# Patient Record
Sex: Female | Born: 1995 | Race: White | Hispanic: No | Marital: Single | Smoking: Never smoker
Health system: Southern US, Community
[De-identification: ages and names within clinical notes are randomized; demographics above are authoritative.]

## PROBLEM LIST (undated history)

## (undated) DIAGNOSIS — R87629 Unspecified abnormal cytological findings in specimens from vagina: Secondary | ICD-10-CM

## (undated) DIAGNOSIS — N200 Calculus of kidney: Secondary | ICD-10-CM

## (undated) DIAGNOSIS — Z3490 Encounter for supervision of normal pregnancy, unspecified, unspecified trimester: Secondary | ICD-10-CM

## (undated) DIAGNOSIS — Z789 Other specified health status: Secondary | ICD-10-CM

## (undated) HISTORY — DX: Other specified health status: Z78.9

## (undated) HISTORY — DX: Unspecified abnormal cytological findings in specimens from vagina: R87.629

## (undated) HISTORY — PX: NO PAST SURGERIES: SHX2092

---

## 2018-01-07 ENCOUNTER — Other Ambulatory Visit: Payer: Self-pay

## 2018-01-07 ENCOUNTER — Emergency Department (HOSPITAL_COMMUNITY): Payer: Medicaid Other

## 2018-01-07 ENCOUNTER — Emergency Department (HOSPITAL_COMMUNITY)
Admission: EM | Admit: 2018-01-07 | Discharge: 2018-01-08 | Disposition: A | Discharge status: Home / Self Care | Payer: Medicaid Other | Attending: Emergency Medicine | Admitting: Emergency Medicine

## 2018-01-07 ENCOUNTER — Encounter (HOSPITAL_COMMUNITY): Payer: Self-pay | Admitting: Emergency Medicine

## 2018-01-07 DIAGNOSIS — Z2913 Encounter for prophylactic Rho(D) immune globulin: Secondary | ICD-10-CM | POA: Diagnosis not present

## 2018-01-07 DIAGNOSIS — O360110 Maternal care for anti-D [Rh] antibodies, first trimester, not applicable or unspecified: Secondary | ICD-10-CM | POA: Insufficient documentation

## 2018-01-07 DIAGNOSIS — Z3A01 Less than 8 weeks gestation of pregnancy: Secondary | ICD-10-CM | POA: Diagnosis not present

## 2018-01-07 DIAGNOSIS — O43891 Other placental disorders, first trimester: Secondary | ICD-10-CM | POA: Insufficient documentation

## 2018-01-07 DIAGNOSIS — O200 Threatened abortion: Secondary | ICD-10-CM | POA: Diagnosis not present

## 2018-01-07 DIAGNOSIS — O468X1 Other antepartum hemorrhage, first trimester: Secondary | ICD-10-CM

## 2018-01-07 DIAGNOSIS — O418X10 Other specified disorders of amniotic fluid and membranes, first trimester, not applicable or unspecified: Secondary | ICD-10-CM

## 2018-01-07 HISTORY — DX: Encounter for supervision of normal pregnancy, unspecified, unspecified trimester: Z34.90

## 2018-01-07 LAB — BASIC METABOLIC PANEL
ANION GAP: 8 (ref 5–15)
BUN: 12 mg/dL (ref 6–20)
CALCIUM: 9.7 mg/dL (ref 8.9–10.3)
CO2: 23 mmol/L (ref 22–32)
Chloride: 107 mmol/L (ref 101–111)
Creatinine, Ser: 0.59 mg/dL (ref 0.44–1.00)
GLUCOSE: 89 mg/dL (ref 65–99)
Potassium: 3.7 mmol/L (ref 3.5–5.1)
Sodium: 138 mmol/L (ref 135–145)

## 2018-01-07 LAB — CBC WITH DIFFERENTIAL/PLATELET
BASOS PCT: 0 %
Basophils Absolute: 0 10*3/uL (ref 0.0–0.1)
EOS ABS: 0.1 10*3/uL (ref 0.0–0.7)
Eosinophils Relative: 1 %
HEMATOCRIT: 38.1 % (ref 36.0–46.0)
Hemoglobin: 13.5 g/dL (ref 12.0–15.0)
Lymphocytes Relative: 35 %
Lymphs Abs: 3.5 10*3/uL (ref 0.7–4.0)
MCH: 31.7 pg (ref 26.0–34.0)
MCHC: 35.4 g/dL (ref 30.0–36.0)
MCV: 89.4 fL (ref 78.0–100.0)
MONO ABS: 0.6 10*3/uL (ref 0.1–1.0)
MONOS PCT: 6 %
NEUTROS ABS: 5.7 10*3/uL (ref 1.7–7.7)
NEUTROS PCT: 58 %
Platelets: 210 10*3/uL (ref 150–400)
RBC: 4.26 MIL/uL (ref 3.87–5.11)
RDW: 12.8 % (ref 11.5–15.5)
WBC: 9.9 10*3/uL (ref 4.0–10.5)

## 2018-01-07 LAB — I-STAT BETA HCG BLOOD, ED (MC, WL, AP ONLY)

## 2018-01-07 MED ORDER — AMLODIPINE BESYLATE 5 MG PO TABS: 5.0000 mg | ORAL_TABLET | Freq: Once | ORAL | Status: DC

## 2018-01-07 MED ORDER — RHO D IMMUNE GLOBULIN 1500 UNIT/2ML IJ SOSY
300.0000 ug | PREFILLED_SYRINGE | Freq: Once | INTRAMUSCULAR | Status: AC
  Administered 2018-01-08: 300 ug via INTRAMUSCULAR

## 2018-01-07 NOTE — ED Triage Notes (Signed)
Pt states was told last weeks she was pregnant. Today states she started bleeding this am. Bright red and brown clots. Pt states she knows she needs Rhogram shot due to her blood type.

## 2018-01-07 NOTE — ED Provider Notes (Signed)
[facility] Provider Note   CSN: [id number] Arrival date & time: 01/07/18  1647     History   Chief Complaint Chief Complaint  Patient presents with  . Threatened Miscarriage    HPI Brenda Carlson is a 22 y.o. female.  HPI   She presents for evaluation of vaginal spotting, today, x2.  She is [redacted] weeks pregnant.  She denies abdominal pain.  She denies having sexual intercourse in the last 24 hours.  She works a heavy job, Designer, multimedialifting pallets.  She denies fever, chills, nausea, vomiting.  She has not had prenatal care, yet.  She is concerned that she is Rh- and needs a RhoGam shot.  There are no other known modifying factors.  Past Medical History:  Diagnosis Date  . Pregnancy     There are no active problems to display for this patient.   History reviewed. No pertinent surgical history.   OB History    Gravida  1   Para      Term      Preterm      AB      Living        SAB      TAB      Ectopic      Multiple      Live Births               Home Medications    Prior to Admission medications   Not on File    Family History No family history on file.  Social History Social History   Tobacco Use  . Smoking status: Never Smoker  . Smokeless tobacco: Never Used  Substance Use Topics  . Alcohol use: Never    Frequency: Never  . Drug use: Never     Allergies   Patient has no known allergies.   Review of Systems Review of Systems  All other systems reviewed and are negative.    Physical Exam Updated Vital Signs BP (!) 119/59   Pulse (!) 57   Temp 98 F (36.7 C) (Oral)   Resp 16   Ht 5\' 7"  (1.702 m)   Wt 65.8 kg (145 lb)   LMP 11/15/2017   SpO2 100%   BMI 22.71 kg/m   Physical Exam  Constitutional: She is oriented to person, place, and time. She appears well-developed and well-nourished. No distress.  HENT:  Head: Normocephalic and atraumatic.  Eyes: Pupils are equal, round, and reactive to light.  Conjunctivae and EOM are normal.  Neck: Normal range of motion and phonation normal. Neck supple.  Cardiovascular: Normal rate and regular rhythm.  Pulmonary/Chest: Effort normal and breath sounds normal. No respiratory distress. She exhibits no tenderness.  Abdominal: Soft. She exhibits no distension. There is no tenderness. There is no guarding.  Musculoskeletal: Normal range of motion.  Neurological: She is alert and oriented to person, place, and time. She exhibits normal muscle tone.  Skin: Skin is warm and dry.  Psychiatric: She has a normal mood and affect. Her behavior is normal. Judgment and thought content normal.  Nursing note and vitals reviewed.    ED Treatments / Results  Labs (all labs ordered are listed, but only abnormal results are displayed) Labs Reviewed  I-STAT BETA HCG BLOOD, ED ([facility], [facility], [facility] ONLY) - Abnormal; Notable for the following components:      Result Value   I-stat hCG, quantitative >2,000.0 (*)    All other components within normal limits  BASIC METABOLIC  PANEL  CBC WITH DIFFERENTIAL/PLATELET  ABO/RH  RH IG WORKUP (INCLUDES ABO/RH)    EKG None  Radiology US Ob Comp < 14 Wks  Result Date: 01/07/2018 CLINICAL DATA:  Bleeding.  Pregnant. EXAM: OBSTETRIC <14 WK [place] AND TRANSVAGINAL OB US TECHNIQUE: Both transabdominal and transvaginal ultrasound examinations were performed for complete evaluation of the gestation as well as the maternal uterus, adnexal regions, and pelvic cul-de-sac. Transvaginal technique was performed to assess early pregnancy. COMPARISON:  None. FINDINGS: Intrauterine gestational sac: Single Yolk sac:  Visualized. MSD: 12.3 mm   6 w   0 d CRL:    mm    w    d                  [place] EDC: Subchorionic hemorrhage: There is a small posterior subchorionic hemorrhage Maternal uterus/adnexae: The ovaries are normal in appearance. IMPRESSION: 1. Single IUP with a gestational sac and yolk sac. Small subchorionic hemorrhage. Electronically Signed   By:  Gerome Sam III M.D   On: 01/07/2018 22:18   US Ob Transvaginal  Result Date: 01/07/2018 CLINICAL DATA:  Bleeding.  Pregnant. EXAM: OBSTETRIC <14 WK [place] AND TRANSVAGINAL OB US TECHNIQUE: Both transabdominal and transvaginal ultrasound examinations were performed for complete evaluation of the gestation as well as the maternal uterus, adnexal regions, and pelvic cul-de-sac. Transvaginal technique was performed to assess early pregnancy. COMPARISON:  None. FINDINGS: Intrauterine gestational sac: Single Yolk sac:  Visualized. MSD: 12.3 mm   6 w   0 d CRL:    mm    w    d                  [place] EDC: Subchorionic hemorrhage: There is a small posterior subchorionic hemorrhage Maternal uterus/adnexae: The ovaries are normal in appearance. IMPRESSION: 1. Single IUP with a gestational sac and yolk sac. Small subchorionic hemorrhage. Electronically Signed   By: Gerome Sam III M.D   On: 01/07/2018 22:18    Procedures .Critical Care Performed by: Mancel Bale, MD Authorized by: Mancel Bale, MD   Critical care provider statement:    Critical care time (minutes):  35   Critical care start time:  01/07/2018 8:40 PM   Critical care end time:  01/07/2018 11:49 PM   Critical care time was exclusive of:  Separately billable procedures and treating other patients   Critical care was necessary to treat or prevent imminent or life-threatening deterioration of the following conditions: Obstetric crisis.   Critical care was time spent personally by me on the following activities:  Blood draw for specimens, development of treatment plan with patient or surrogate, discussions with consultants, evaluation of patient's response to treatment, examination of patient, obtaining history from patient or surrogate, ordering and performing treatments and interventions, ordering and review of laboratory studies, pulse oximetry, re-evaluation of patient's condition, review of old charts and ordering and review of radiographic  studies   (including critical care time)  Medications Ordered in ED Medications  rho (d) immune globulin (RHIG/RHOPHYLAC) injection 300 mcg (has no administration in time range)     Initial Impression / Assessment and Plan / ED Course  I have reviewed the triage vital signs and the nursing notes.  Pertinent labs & imaging results that were available during my care of the patient were reviewed by me and considered in my medical decision making (see chart for details).      Patient Vitals for the past 24 hrs:  BP Temp Temp  src Pulse Resp SpO2 Height Weight  01/07/18 2130 (!) 119/59 - - (!) 57 - 100 % - -  01/07/18 2128 115/66 98 F (36.7 C) Oral (!) 54 16 100 % - -  01/07/18 1719 - - - - - - 5\' 7"  (1.702 m) 65.8 kg (145 lb)  01/07/18 1717 128/61 99 F (37.2 C) Oral 67 18 98 % - -    11:48 PM Reevaluation with update and discussion. After initial assessment and treatment, an updated evaluation reveals no change in clinical status, findings discussed with patient and boyfriend, all questions answered. Mancel Bale   Medical Decision Making: Vaginal bleeding, indicating threatened miscarriage, with small subchorionic hemorrhage.  Patient Rh- and RhoGam is been administered.  No indication for hospitalization at this time.  CRITICAL CARE-yes Performed by: Mancel Bale   Nursing Notes Reviewed/ Care Coordinated Applicable Imaging Reviewed Interpretation of Laboratory Data incorporated into ED treatment  The patient appears reasonably screened and/or stabilized for discharge and I doubt any other medical condition or other [facility] requiring further screening, evaluation, or treatment in the ED at this time prior to discharge.  Plan: Home Medications-prenatal vitamins; Home Treatments-rest, fluids, no sexual intercourse; return here if the recommended treatment, does not improve the symptoms; Recommended follow up-obstetric follow-up as soon as possible    Final Clinical  Impressions(s) / ED Diagnoses   Final diagnoses:  Threatened miscarriage  Subchorionic hemorrhage of placenta in first trimester, single or unspecified fetus    ED Discharge Orders    None       Mancel Bale, MD 01/07/18 2350

## 2018-01-07 NOTE — Discharge Instructions (Signed)
It is important to follow-up with an obstetric doctor as soon as possible about the bleeding.  We have treated you with RhoGam to help prevent sensitization, if the fetus is Rh+.  Do not have sexual intercourse until you are released to do that by the obstetrician.

## 2018-01-08 LAB — ABO/RH
ABO/RH(D): B NEG
ANTIBODY SCREEN: NEGATIVE

## 2018-01-09 LAB — RH IG WORKUP (INCLUDES ABO/RH)
ABO/RH(D): B NEG
Antibody Screen: NEGATIVE
GESTATIONAL AGE(WKS): 6
Unit division: 0

## 2018-02-03 ENCOUNTER — Encounter (HOSPITAL_COMMUNITY): Payer: Self-pay | Admitting: Emergency Medicine

## 2018-02-03 ENCOUNTER — Other Ambulatory Visit: Payer: Self-pay

## 2018-02-03 ENCOUNTER — Emergency Department (HOSPITAL_COMMUNITY)
Admission: EM | Admit: 2018-02-03 | Discharge: 2018-02-03 | Disposition: A | Discharge status: Home / Self Care | Payer: Medicaid Other | Attending: Emergency Medicine | Admitting: Emergency Medicine

## 2018-02-03 DIAGNOSIS — O219 Vomiting of pregnancy, unspecified: Secondary | ICD-10-CM | POA: Diagnosis not present

## 2018-02-03 DIAGNOSIS — Z3A11 11 weeks gestation of pregnancy: Secondary | ICD-10-CM | POA: Diagnosis not present

## 2018-02-03 DIAGNOSIS — Z5321 Procedure and treatment not carried out due to patient leaving prior to being seen by health care provider: Secondary | ICD-10-CM | POA: Insufficient documentation

## 2018-02-03 LAB — CBC WITH DIFFERENTIAL/PLATELET
Basophils Absolute: 0 10*3/uL (ref 0.0–0.1)
Basophils Relative: 0 %
EOS PCT: 1 %
Eosinophils Absolute: 0.1 10*3/uL (ref 0.0–0.7)
HCT: 36.9 % (ref 36.0–46.0)
Hemoglobin: 13.2 g/dL (ref 12.0–15.0)
LYMPHS ABS: 2.5 10*3/uL (ref 0.7–4.0)
LYMPHS PCT: 28 %
MCH: 32 pg (ref 26.0–34.0)
MCHC: 35.8 g/dL (ref 30.0–36.0)
MCV: 89.6 fL (ref 78.0–100.0)
MONO ABS: 0.8 10*3/uL (ref 0.1–1.0)
Monocytes Relative: 8 %
Neutro Abs: 5.8 10*3/uL (ref 1.7–7.7)
Neutrophils Relative %: 63 %
PLATELETS: 202 10*3/uL (ref 150–400)
RBC: 4.12 MIL/uL (ref 3.87–5.11)
RDW: 12.6 % (ref 11.5–15.5)
WBC: 9.2 10*3/uL (ref 4.0–10.5)

## 2018-02-03 LAB — BASIC METABOLIC PANEL
Anion gap: 8 (ref 5–15)
BUN: 11 mg/dL (ref 6–20)
CALCIUM: 9.2 mg/dL (ref 8.9–10.3)
CO2: 23 mmol/L (ref 22–32)
CREATININE: 0.51 mg/dL (ref 0.44–1.00)
Chloride: 104 mmol/L (ref 98–111)
GFR calc Af Amer: >60 mL/min (ref >60)
GLUCOSE: 86 mg/dL (ref 70–99)
POTASSIUM: 3.6 mmol/L (ref 3.5–5.1)
SODIUM: 135 mmol/L (ref 135–145)

## 2018-02-03 NOTE — ED Triage Notes (Signed)
Pt states has had been vomiting everyday x 2 months. No OB yet. Mm wet. States water is coming back up now. nad noted in triage

## 2018-02-03 NOTE — ED Notes (Signed)
Pt not in waiting room x 2 

## 2018-02-03 NOTE — ED Notes (Signed)
Pt not in wiating area x 1

## 2018-03-01 ENCOUNTER — Ambulatory Visit (INDEPENDENT_AMBULATORY_CARE_PROVIDER_SITE_OTHER): Payer: Medicaid Other | Admitting: Women's Health

## 2018-03-01 ENCOUNTER — Encounter: Payer: Self-pay | Admitting: Women's Health

## 2018-03-01 ENCOUNTER — Other Ambulatory Visit (INDEPENDENT_AMBULATORY_CARE_PROVIDER_SITE_OTHER): Payer: Medicaid Other

## 2018-03-01 ENCOUNTER — Encounter (INDEPENDENT_AMBULATORY_CARE_PROVIDER_SITE_OTHER): Payer: Self-pay

## 2018-03-01 ENCOUNTER — Other Ambulatory Visit (HOSPITAL_COMMUNITY)
Admission: RE | Admit: 2018-03-01 | Discharge: 2018-03-01 | Disposition: A | Discharge status: Home / Self Care | Payer: Medicaid Other | Source: Ambulatory Visit | Attending: Obstetrics & Gynecology | Admitting: Obstetrics & Gynecology

## 2018-03-01 ENCOUNTER — Other Ambulatory Visit: Payer: Self-pay

## 2018-03-01 ENCOUNTER — Other Ambulatory Visit: Payer: Self-pay | Admitting: Women's Health

## 2018-03-01 VITALS — BP 135/74 | HR 54 | Wt 154.0 lb

## 2018-03-01 DIAGNOSIS — Z3682 Encounter for antenatal screening for nuchal translucency: Secondary | ICD-10-CM

## 2018-03-01 DIAGNOSIS — Z3A13 13 weeks gestation of pregnancy: Secondary | ICD-10-CM | POA: Insufficient documentation

## 2018-03-01 DIAGNOSIS — Z3491 Encounter for supervision of normal pregnancy, unspecified, first trimester: Secondary | ICD-10-CM

## 2018-03-01 DIAGNOSIS — Z1389 Encounter for screening for other disorder: Secondary | ICD-10-CM

## 2018-03-01 DIAGNOSIS — Z01419 Encounter for gynecological examination (general) (routine) without abnormal findings: Secondary | ICD-10-CM | POA: Diagnosis not present

## 2018-03-01 DIAGNOSIS — O26899 Other specified pregnancy related conditions, unspecified trimester: Secondary | ICD-10-CM

## 2018-03-01 DIAGNOSIS — Z3482 Encounter for supervision of other normal pregnancy, second trimester: Secondary | ICD-10-CM

## 2018-03-01 DIAGNOSIS — Z331 Pregnant state, incidental: Secondary | ICD-10-CM

## 2018-03-01 DIAGNOSIS — Z6791 Unspecified blood type, Rh negative: Secondary | ICD-10-CM

## 2018-03-01 DIAGNOSIS — Z3490 Encounter for supervision of normal pregnancy, unspecified, unspecified trimester: Secondary | ICD-10-CM | POA: Insufficient documentation

## 2018-03-01 DIAGNOSIS — Z124 Encounter for screening for malignant neoplasm of cervix: Secondary | ICD-10-CM

## 2018-03-01 LAB — POCT URINALYSIS DIPSTICK OB
Glucose, UA: NEGATIVE — AB
KETONES UA: NEGATIVE
NITRITE UA: NEGATIVE
POC,PROTEIN,UA: NEGATIVE
RBC UA: NEGATIVE

## 2018-03-01 NOTE — Progress Notes (Signed)
INITIAL OBSTETRICAL VISIT Patient name: Brenda Carlson MRN [id number]  Date of birth: 1996/01/27 Chief Complaint:   Initial Prenatal Visit  History of Present Illness:   Brenda Carlson is a 22 y.o. G76P1001 Caucasian female at [redacted]w[redacted]d by today's u/s, with an Estimated Date of Delivery: 09/02/18 being seen today for her initial obstetrical visit.   Her obstetrical history is significant for term uncomplicated SVB x 1 in [place].   Today she reports n/v, requests meds.  Patient's last menstrual period was 11/15/2017. Last pap enver. Results were: n/a Review of Systems:   Pertinent items are noted in HPI Denies cramping/contractions, leakage of fluid, vaginal bleeding, abnormal vaginal discharge w/ itching/odor/irritation, headaches, visual changes, shortness of breath, chest pain, abdominal pain, severe nausea/vomiting, or problems with urination or bowel movements unless otherwise stated above.  Pertinent History Reviewed:  Reviewed past medical,surgical, social, obstetrical and family history.  Reviewed problem list, medications and allergies. OB History  Gravida Para Term Preterm AB Living  2 1 1     1   SAB TAB Ectopic Multiple Live Births          1    # Outcome Date GA Lbr Len/2nd Weight Sex Delivery Anes PTL Lv  2 Current           1 Term 03/22/15 [redacted]w[redacted]d   F Vag-Spont  N LIV   Physical Assessment:   Vitals:   03/01/18 0905  BP: 135/74  Pulse: (!) 54  Weight: 154 lb (69.9 kg)  Body mass index is 24.12 kg/m.       Physical Examination:  General appearance - well appearing, and in no distress  Mental status - alert, oriented to person, place, and time  Psych:  She has a normal mood and affect  Skin - warm and dry, normal color, no suspicious lesions noted  Chest - effort normal, all lung fields clear to auscultation bilaterally  Heart - normal rate and regular rhythm  Abdomen - soft, nontender  Extremities:  No swelling or varicosities noted  Pelvic - VULVA: normal appearing  vulva with no masses, tenderness or lesions  VAGINA: normal appearing vagina with normal color and discharge, no lesions  CERVIX: normal appearing cervix without discharge or lesions, no CMT  Thin prep pap is done w/ reflx HR HPV cotesting  Fetal Heart Rate (bpm): +u/s via u/s  Results for orders placed or performed in visit on 03/01/18 (from the past 24 hour(s))  POC Urinalysis Dipstick OB   Collection Time: 03/01/18  9:16 AM  Result Value Ref Range   Color, UA     Clarity, UA     Glucose, UA Negative (A) (none)   Bilirubin, UA     Ketones, UA neg    Spec Grav, UA  1.010 - 1.025   Blood, UA neg    pH, UA  5.0 - 8.0   POC Protein UA Negative Negative, Trace   Urobilinogen, UA  0.2 or 1.0 E.U./dL   Nitrite, UA neg    Leukocytes, UA Moderate (2+) (A) Negative   Appearance     Odor      Assessment & Plan:  1) Low-Risk Pregnancy G2P1001 at [redacted]w[redacted]d with an Estimated Date of Delivery: 09/02/18   2) Initial OB visit  3) N/V> rx diclegis  Meds: No orders of the defined types were placed in this encounter.   Initial labs obtained Continue prenatal vitamins Reviewed n/v relief measures and warning s/s to report Reviewed recommended weight gain based  on pre-gravid BMI Encouraged well-balanced diet Genetic Screening discussed Integrated Screen: requested, 1st IT done today Cystic fibrosis screening discussed requested Ultrasound discussed; fetal survey: requested CCNC completed  Follow-up: Return in about 3 weeks (around 03/22/2018) for LROB, 2nd IT.   Orders Placed This Encounter  Procedures  . Urine Culture  . Obstetric Panel, Including HIV  . Urinalysis, Routine w reflex microscopic  . Cystic Fibrosis Mutation 97  . Pain Management Screening Profile (10S)  . Integrated 1  . POC Urinalysis Dipstick OB    Cheral MarkerKimberly R Pascal Stiggers CNM, [facility]NP-BC 03/01/2018 10:43 AM

## 2018-03-01 NOTE — Patient Instructions (Signed)
Brenda FerrierBobbie Carlson, I greatly value your feedback.  If you receive a survey following your visit with us today, we appreciate you taking the time to fill it out.  Thanks, Brenda HaffKim Carlson, CNM, WHNP-BC   Nausea & Vomiting  Have saltine crackers or pretzels by your bed and eat a few bites before you raise your head out of bed in the morning  Eat small frequent meals throughout the day instead of large meals  Drink plenty of fluids throughout the day to stay hydrated, just don't drink a lot of fluids with your meals.  This can make your stomach fill up faster making you feel sick  Do not brush your teeth right after you eat  Products with real ginger are good for nausea, like ginger ale and ginger hard candy Make sure it says made with real ginger!  Sucking on sour candy like lemon heads is also good for nausea  If your prenatal vitamins make you nauseated, take them at night so you will sleep through the nausea  Sea Bands  If you feel like you need medicine for the nausea & vomiting please let us know  If you are unable to keep any fluids or food down please let us know   Constipation  Drink plenty of fluid, preferably water, throughout the day  Eat foods high in fiber such as fruits, vegetables, and grains  Exercise, such as walking, is a good way to keep your bowels regular  Drink warm fluids, especially warm prune juice, or decaf coffee  Eat a 1/2 cup of real oatmeal (not instant), 1/2 cup applesauce, and 1/2-1 cup warm prune juice every day  If needed, you may take Colace (docusate sodium) stool softener once or twice a day to help keep the stool soft. If you are pregnant, wait until you are out of your first trimester (12-14 weeks of pregnancy)  If you still are having problems with constipation, you may take Miralax once daily as needed to help keep your bowels regular.  If you are pregnant, wait until you are out of your first trimester (12-14 weeks of pregnancy)   First  Trimester of Pregnancy The first trimester of pregnancy is from week 1 until the end of week 12 (months 1 through 3). A week after a sperm fertilizes an egg, the egg will implant on the wall of the uterus. This embryo will begin to develop into a baby. Genes from you and your partner are forming the baby. The female genes determine whether the baby is a boy or a girl. At 6-8 weeks, the eyes and face are formed, and the heartbeat can be seen on ultrasound. At the end of 12 weeks, all the baby's organs are formed.  Now that you are pregnant, you will want to do everything you can to have a healthy baby. Two of the most important things are to get good prenatal care and to follow your health care provider's instructions. Prenatal care is all the medical care you receive before the baby's birth. This care will help prevent, find, and treat any problems during the pregnancy and childbirth. BODY CHANGES Your body goes through many changes during pregnancy. The changes vary from woman to woman.   You may gain or lose a couple of pounds at first.  You may feel sick to your stomach (nauseous) and throw up (vomit). If the vomiting is uncontrollable, call your health care provider.  You may tire easily.  You may develop headaches that  can be relieved by medicines approved by your health care provider.  You may urinate more often. Painful urination may mean you have a bladder infection.  You may develop heartburn as a result of your pregnancy.  You may develop constipation because certain hormones are causing the muscles that push waste through your intestines to slow down.  You may develop hemorrhoids or swollen, bulging veins (varicose veins).  Your breasts may begin to grow larger and become tender. Your nipples may stick out more, and the tissue that surrounds them (areola) may become darker.  Your gums may bleed and may be sensitive to brushing and flossing.  Dark spots or blotches (chloasma, mask  of pregnancy) may develop on your face. This will likely fade after the baby is born.  Your menstrual periods will stop.  You may have a loss of appetite.  You may develop cravings for certain kinds of food.  You may have changes in your emotions from day to day, such as being excited to be pregnant or being concerned that something may go wrong with the pregnancy and baby.  You may have more vivid and strange dreams.  You may have changes in your hair. These can include thickening of your hair, rapid growth, and changes in texture. Some women also have hair loss during or after pregnancy, or hair that feels dry or thin. Your hair will most likely return to normal after your baby is born. WHAT TO EXPECT AT YOUR PRENATAL VISITS During a routine prenatal visit:  You will be weighed to make sure you and the baby are growing normally.  Your blood pressure will be taken.  Your abdomen will be measured to track your baby's growth.  The fetal heartbeat will be listened to starting around week 10 or 12 of your pregnancy.  Test results from any previous visits will be discussed. Your health care provider may ask you:  How you are feeling.  If you are feeling the baby move.  If you have had any abnormal symptoms, such as leaking fluid, bleeding, severe headaches, or abdominal cramping.  If you have any questions. Other tests that may be performed during your first trimester include:  Blood tests to find your blood type and to check for the presence of any previous infections. They will also be used to check for low iron levels (anemia) and Rh antibodies. Later in the pregnancy, blood tests for diabetes will be done along with other tests if problems develop.  Urine tests to check for infections, diabetes, or protein in the urine.  An ultrasound to confirm the proper growth and development of the baby.  An amniocentesis to check for possible genetic problems.  Fetal screens for spina  bifida and Down syndrome.  You may need other tests to make sure you and the baby are doing well. HOME CARE INSTRUCTIONS  Medicines  Follow your health care provider's instructions regarding medicine use. Specific medicines may be either safe or unsafe to take during pregnancy.  Take your prenatal vitamins as directed.  If you develop constipation, try taking a stool softener if your health care provider approves. Diet  Eat regular, well-balanced meals. Choose a variety of foods, such as meat or vegetable-based protein, fish, milk and low-fat dairy products, vegetables, fruits, and whole grain breads and cereals. Your health care provider will help you determine the amount of weight gain that is right for you.  Avoid raw meat and uncooked cheese. These carry germs that can cause  birth defects in the baby.  Eating four or five small meals rather than three large meals a day may help relieve nausea and vomiting. If you start to feel nauseous, eating a few soda crackers can be helpful. Drinking liquids between meals instead of during meals also seems to help nausea and vomiting.  If you develop constipation, eat more high-fiber foods, such as fresh vegetables or fruit and whole grains. Drink enough fluids to keep your urine clear or pale yellow. Activity and Exercise  Exercise only as directed by your health care provider. Exercising will help you:  Control your weight.  Stay in shape.  Be prepared for labor and delivery.  Experiencing pain or cramping in the lower abdomen or low back is a good sign that you should stop exercising. Check with your health care provider before continuing normal exercises.  Try to avoid standing for long periods of time. Move your legs often if you must stand in one place for a long time.  Avoid heavy lifting.  Wear low-heeled shoes, and practice good posture.  You may continue to have sex unless your health care provider directs you  otherwise. Relief of Pain or Discomfort  Wear a good support bra for breast tenderness.   Take warm sitz baths to soothe any pain or discomfort caused by hemorrhoids. Use hemorrhoid cream if your health care provider approves.   Rest with your legs elevated if you have leg cramps or low back pain.  If you develop varicose veins in your legs, wear support hose. Elevate your feet for 15 minutes, 3-4 times a day. Limit salt in your diet. Prenatal Care  Schedule your prenatal visits by the twelfth week of pregnancy. They are usually scheduled monthly at first, then more often in the last 2 months before delivery.  Write down your questions. Take them to your prenatal visits.  Keep all your prenatal visits as directed by your health care provider. Safety  Wear your seat belt at all times when driving.  Make a list of emergency phone numbers, including numbers for family, friends, the hospital, and police and fire departments. General Tips  Ask your health care provider for a referral to a local prenatal education class. Begin classes no later than at the beginning of month 6 of your pregnancy.  Ask for help if you have counseling or nutritional needs during pregnancy. Your health care provider can offer advice or refer you to specialists for help with various needs.  Do not use hot tubs, steam rooms, or saunas.  Do not douche or use tampons or scented sanitary pads.  Do not cross your legs for long periods of time.  Avoid cat litter boxes and soil used by cats. These carry germs that can cause birth defects in the baby and possibly loss of the fetus by miscarriage or stillbirth.  Avoid all smoking, herbs, alcohol, and medicines not prescribed by your health care provider. Chemicals in these affect the formation and growth of the baby.  Schedule a dentist appointment. At home, brush your teeth with a soft toothbrush and be gentle when you floss. SEEK MEDICAL CARE IF:   You have  dizziness.  You have mild pelvic cramps, pelvic pressure, or nagging pain in the abdominal area.  You have persistent nausea, vomiting, or diarrhea.  You have a bad smelling vaginal discharge.  You have pain with urination.  You notice increased swelling in your face, hands, legs, or ankles. SEEK IMMEDIATE MEDICAL CARE IF:  You have a fever.  You are leaking fluid from your vagina.  You have spotting or bleeding from your vagina.  You have severe abdominal cramping or pain.  You have rapid weight gain or loss.  You vomit blood or material that looks like coffee grounds.  You are exposed to [place] measles and have never had them.  You are exposed to fifth disease or chickenpox.  You develop a severe headache.  You have shortness of breath.  You have any kind of trauma, such as from a fall or a car accident. Document Released: 07/08/2001 Document Revised: 11/28/2013 Document Reviewed: 05/24/2013 [facility] Patient Information 2015 [place], [place]. This information is not intended to replace advice given to you by your health care provider. Make sure you discuss any questions you have with your health care provider.

## 2018-03-01 NOTE — Progress Notes (Signed)
US 13+4 wks,crl 75.23 mm,normal ovaries bilat,NB present,NT 1.9 mm,fhr 171 bpm,anterior pl gr 0

## 2018-03-02 ENCOUNTER — Encounter: Payer: Self-pay | Admitting: Women's Health

## 2018-03-02 DIAGNOSIS — F1290 Cannabis use, unspecified, uncomplicated: Secondary | ICD-10-CM | POA: Insufficient documentation

## 2018-03-02 LAB — PMP SCREEN PROFILE (10S), URINE
AMPHETAMINE SCREEN URINE: NEGATIVE ng/mL
BARBITURATE SCREEN URINE: NEGATIVE ng/mL
BENZODIAZEPINE SCREEN, URINE: NEGATIVE ng/mL
CANNABINOIDS UR QL SCN: POSITIVE ng/mL — AB
CREATININE(CRT), U: 87 mg/dL (ref 20.0–300.0)
Cocaine (Metab) Scrn, Ur: NEGATIVE ng/mL
METHADONE SCREEN, URINE: NEGATIVE ng/mL
OXYCODONE+OXYMORPHONE UR QL SCN: NEGATIVE ng/mL
Opiate Scrn, Ur: NEGATIVE ng/mL
PHENCYCLIDINE QUANTITATIVE URINE: NEGATIVE ng/mL
PROPOXYPHENE SCREEN URINE: NEGATIVE ng/mL
Ph of Urine: 6.7 (ref 4.5–8.9)

## 2018-03-02 LAB — MED LIST OPTION NOT SELECTED

## 2018-03-03 ENCOUNTER — Encounter: Payer: Self-pay | Admitting: Women's Health

## 2018-03-03 DIAGNOSIS — R87619 Unspecified abnormal cytological findings in specimens from cervix uteri: Secondary | ICD-10-CM | POA: Insufficient documentation

## 2018-03-03 LAB — CYTOLOGY - PAP
Chlamydia: NEGATIVE
Diagnosis: UNDETERMINED — AB
HPV (WINDOPATH): NOT DETECTED
NEISSERIA GONORRHEA: NEGATIVE

## 2018-03-03 LAB — INTEGRATED 1
Crown Rump Length: 75.2 mm
Gest. Age on Collection Date: 13.4 weeks
Maternal Age at EDD: 22.9 yr
NUCHAL TRANSLUCENCY (NT): 1.9 mm
Number of Fetuses: 1
PAPP-A Value: 987.1 ng/mL
Weight: 154 [lb_av]

## 2018-03-03 LAB — URINE CULTURE

## 2018-03-04 ENCOUNTER — Telehealth: Payer: Self-pay | Admitting: Women's Health

## 2018-03-04 NOTE — Telephone Encounter (Signed)
Called pt to notify of abnormal pap, number has been disconnected. Will send letter. Cheral MarkerKimberly R. Booker, CNM, [facility]NP[facility] 03/04/2018 4:35 PM

## 2018-03-09 LAB — OBSTETRIC PANEL, INCLUDING HIV
BASOS: 0 %
Basophils Absolute: 0 10*3/uL (ref 0.0–0.2)
EOS (ABSOLUTE): 0.1 10*3/uL (ref 0.0–0.4)
EOS: 1 %
HEMATOCRIT: 40.4 % (ref 34.0–46.6)
HIV SCREEN 4TH GENERATION: NONREACTIVE
Hemoglobin: 13.8 g/dL (ref 11.1–15.9)
Hepatitis B Surface Ag: NEGATIVE
IMMATURE GRANULOCYTES: 0 %
Immature Grans (Abs): 0 10*3/uL (ref 0.0–0.1)
Lymphocytes Absolute: 2.2 10*3/uL (ref 0.7–3.1)
Lymphs: 24 %
MCH: 31.1 pg (ref 26.6–33.0)
MCHC: 34.2 g/dL (ref 31.5–35.7)
MCV: 91 fL (ref 79–97)
Monocytes Absolute: 0.6 10*3/uL (ref 0.1–0.9)
Monocytes: 6 %
NEUTROS ABS: 6.1 10*3/uL (ref 1.4–7.0)
Neutrophils: 69 %
Platelets: 229 10*3/uL (ref 150–450)
RBC: 4.44 x10E6/uL (ref 3.77–5.28)
RDW: 13.4 % (ref 12.3–15.4)
RH TYPE: NEGATIVE
RPR Ser Ql: NONREACTIVE
Rubella Antibodies, IGG: 1.56 index (ref >0.99)
WBC: 8.9 10*3/uL (ref 3.4–10.8)

## 2018-03-09 LAB — MICROSCOPIC EXAMINATION
CASTS: NONE SEEN /LPF
RBC, UA: NONE SEEN /hpf (ref 0–2)

## 2018-03-09 LAB — URINALYSIS, ROUTINE W REFLEX MICROSCOPIC
Bilirubin, UA: NEGATIVE
Glucose, UA: NEGATIVE
Ketones, UA: NEGATIVE
Nitrite, UA: NEGATIVE
PH UA: 7 (ref 5.0–7.5)
PROTEIN UA: NEGATIVE
RBC, UA: NEGATIVE
Specific Gravity, UA: 1.014 (ref 1.005–1.030)
Urobilinogen, Ur: 0.2 mg/dL (ref 0.2–1.0)

## 2018-03-09 LAB — CYSTIC FIBROSIS MUTATION 97: Interpretation: NOT DETECTED

## 2018-03-09 LAB — AB SCR+ANTIBODY ID: ANTIBODY SCREEN: POSITIVE — AB

## 2018-03-15 ENCOUNTER — Telehealth: Payer: Self-pay | Admitting: *Deleted

## 2018-03-15 NOTE — Telephone Encounter (Signed)
Pt came by office with complaints of being constipated. Also having vomiting. Pt was advised to eat 4 prunes everyday and drink prune juice and lots of water. Selena BattenKim, CNM also advised to 2 fleets enemas today, dulcolax suppository every 8 hours, bottle of magnesium citrate tomorrow am and then miralax twice a day thereafter. Pt has appt 8/26. Advised to keep that appt. Call sooner with any problems. Pt voiced understanding. JSY

## 2018-03-22 ENCOUNTER — Encounter: Payer: Self-pay | Admitting: Women's Health

## 2018-03-22 ENCOUNTER — Ambulatory Visit (INDEPENDENT_AMBULATORY_CARE_PROVIDER_SITE_OTHER): Payer: Medicaid Other | Admitting: Women's Health

## 2018-03-22 VITALS — BP 113/63 | HR 86 | Wt 154.0 lb

## 2018-03-22 DIAGNOSIS — Z1379 Encounter for other screening for genetic and chromosomal anomalies: Secondary | ICD-10-CM | POA: Diagnosis not present

## 2018-03-22 DIAGNOSIS — Z3482 Encounter for supervision of other normal pregnancy, second trimester: Secondary | ICD-10-CM

## 2018-03-22 DIAGNOSIS — Z363 Encounter for antenatal screening for malformations: Secondary | ICD-10-CM

## 2018-03-22 DIAGNOSIS — Z331 Pregnant state, incidental: Secondary | ICD-10-CM

## 2018-03-22 DIAGNOSIS — Z3A16 16 weeks gestation of pregnancy: Secondary | ICD-10-CM

## 2018-03-22 DIAGNOSIS — Z1389 Encounter for screening for other disorder: Secondary | ICD-10-CM

## 2018-03-22 NOTE — Patient Instructions (Addendum)
Brenda Carlson, I greatly value your feedback.  If you receive a survey following your visit with [place] today, we appreciate you taking the time to fill it out.  Thanks, [name], CNM, WHNP-BC   Constipation  Drink plenty of fluid, preferably water, throughout the day  Eat foods high in fiber such as fruits, vegetables, and grains  Exercise, such as walking, is a good way to keep your bowels regular  Drink warm fluids, especially warm prune juice, or decaf coffee  Eat a 1/2 cup of real oatmeal (not instant), 1/2 cup applesauce, and 1/2-1 cup warm prune juice every day  If needed, you may take Colace (docusate sodium) stool softener once or twice a day to help keep the stool soft. If you are pregnant, wait until you are out of your first trimester (12-14 weeks of pregnancy)  If you still are having problems with constipation, you may take Miralax once daily as needed to help keep your bowels regular.  If you are pregnant, wait until you are out of your first trimester (12-14 weeks of pregnancy)    Second Trimester of Pregnancy The second trimester is from week 14 through week 27 (months 4 through 6). The second trimester is often a time when you feel your best. Your body has adjusted to being pregnant, and you begin to feel better physically. Usually, morning sickness has lessened or quit completely, you may have more energy, and you may have an increase in appetite. The second trimester is also a time when the fetus is growing rapidly. At the end of the sixth month, the fetus is about 9 inches long and weighs about 1 pounds. You will likely begin to feel the baby move (quickening) between 16 and 20 weeks of pregnancy. Body changes during your second trimester Your body continues to go through many changes during your second trimester. The changes vary from woman to woman.  Your weight will continue to increase. You will notice your lower abdomen bulging out.  You may begin to get stretch  marks on your hips, abdomen, and breasts.  You may develop headaches that can be relieved by medicines. The medicines should be approved by your health care provider.  You may urinate more often because the fetus is pressing on your bladder.  You may develop or continue to have heartburn as a result of your pregnancy.  You may develop constipation because certain hormones are causing the muscles that push waste through your intestines to slow down.  You may develop hemorrhoids or swollen, bulging veins (varicose veins).  You may have back pain. This is caused by: ? Weight gain. ? Pregnancy hormones that are relaxing the joints in your pelvis. ? A shift in weight and the muscles that support your balance.  Your breasts will continue to grow and they will continue to become tender.  Your gums may bleed and may be sensitive to brushing and flossing.  Dark spots or blotches (chloasma, mask of pregnancy) may develop on your face. This will likely fade after the baby is born.  A dark line from your belly button to the pubic area (linea nigra) may appear. This will likely fade after the baby is born.  You may have changes in your hair. These can include thickening of your hair, rapid growth, and changes in texture. Some women also have hair loss during or after pregnancy, or hair that feels dry or thin. Your hair will most likely return to normal after your baby  is born.  What to expect at prenatal visits During a routine prenatal visit:  You will be weighed to make sure you and the fetus are growing normally.  Your blood pressure will be taken.  Your abdomen will be measured to track your baby's growth.  The fetal heartbeat will be listened to.  Any test results from the previous visit will be discussed.  Your health care provider may ask you:  How you are feeling.  If you are feeling the baby move.  If you have had any abnormal symptoms, such as leaking fluid, bleeding,  severe headaches, or abdominal cramping.  If you are using any tobacco products, including cigarettes, chewing tobacco, and electronic cigarettes.  If you have any questions.  Other tests that may be performed during your second trimester include:  Blood tests that check for: ? Low iron levels (anemia). ? High blood sugar that affects pregnant women (gestational diabetes) between 76 and 28 weeks. ? Rh antibodies. This is to check for a protein on red blood cells (Rh factor).  Urine tests to check for infections, diabetes, or protein in the urine.  An ultrasound to confirm the proper growth and development of the baby.  An amniocentesis to check for possible genetic problems.  Fetal screens for spina bifida and Down syndrome.  HIV (human immunodeficiency virus) testing. Routine prenatal testing includes screening for HIV, unless you choose not to have this test.  Follow these instructions at home: Medicines  Follow your health care provider's instructions regarding medicine use. Specific medicines may be either safe or unsafe to take during pregnancy.  Take a prenatal vitamin that contains at least 600 micrograms (mcg) of folic acid.  If you develop constipation, try taking a stool softener if your health care provider approves. Eating and drinking  Eat a balanced diet that includes fresh fruits and vegetables, whole grains, good sources of protein such as meat, eggs, or tofu, and low-fat dairy. Your health care provider will help you determine the amount of weight gain that is right for you.  Avoid raw meat and uncooked cheese. These carry germs that can cause birth defects in the baby.  If you have low calcium intake from food, talk to your health care provider about whether you should take a daily calcium supplement.  Limit foods that are high in fat and processed sugars, such as fried and sweet foods.  To prevent constipation: ? Drink enough fluid to keep your urine clear  or pale yellow. ? Eat foods that are high in fiber, such as fresh fruits and vegetables, whole grains, and beans. Activity  Exercise only as directed by your health care provider. Most women can continue their usual exercise routine during pregnancy. Try to exercise for 30 minutes at least 5 days a week. Stop exercising if you experience uterine contractions.  Avoid heavy lifting, wear low heel shoes, and practice good posture.  A sexual relationship may be continued unless your health care provider directs you otherwise. Relieving pain and discomfort  Wear a good support bra to prevent discomfort from breast tenderness.  Take warm sitz baths to soothe any pain or discomfort caused by hemorrhoids. Use hemorrhoid cream if your health care provider approves.  Rest with your legs elevated if you have leg cramps or low back pain.  If you develop varicose veins, wear support hose. Elevate your feet for 15 minutes, 3-4 times a day. Limit salt in your diet. Prenatal Care  Write down your questions. Take  them to your prenatal visits.  Keep all your prenatal visits as told by your health care provider. This is important. Safety  Wear your seat belt at all times when driving.  Make a list of emergency phone numbers, including numbers for family, friends, the hospital, and police and fire departments. General instructions  Ask your health care provider for a referral to a local prenatal education class. Begin classes no later than the beginning of month 6 of your pregnancy.  Ask for help if you have counseling or nutritional needs during pregnancy. Your health care provider can offer advice or refer you to specialists for help with various needs.  Do not use hot tubs, steam rooms, or saunas.  Do not douche or use tampons or scented sanitary pads.  Do not cross your legs for long periods of time.  Avoid cat litter boxes and soil used by cats. These carry germs that can cause birth defects  in the baby and possibly loss of the fetus by miscarriage or stillbirth.  Avoid all smoking, herbs, alcohol, and unprescribed drugs. Chemicals in these products can affect the formation and growth of the baby.  Do not use any products that contain nicotine or tobacco, such as cigarettes and e-cigarettes. If you need help quitting, ask your health care provider.  Visit your dentist if you have not gone yet during your pregnancy. Use a soft toothbrush to brush your teeth and be gentle when you floss. Contact a health care provider if:  You have dizziness.  You have mild pelvic cramps, pelvic pressure, or nagging pain in the abdominal area.  You have persistent nausea, vomiting, or diarrhea.  You have a bad smelling vaginal discharge.  You have pain when you urinate. Get help right away if:  You have a fever.  You are leaking fluid from your vagina.  You have spotting or bleeding from your vagina.  You have severe abdominal cramping or pain.  You have rapid weight gain or weight loss.  You have shortness of breath with chest pain.  You notice sudden or extreme swelling of your face, hands, ankles, feet, or legs.  You have not felt your baby move in over an hour.  You have severe headaches that do not go away when you take medicine.  You have vision changes. Summary  The second trimester is from week 14 through week 27 (months 4 through 6). It is also a time when the fetus is growing rapidly.  Your body goes through many changes during pregnancy. The changes vary from woman to woman.  Avoid all smoking, herbs, alcohol, and unprescribed drugs. These chemicals affect the formation and growth your baby.  Do not use any tobacco products, such as cigarettes, chewing tobacco, and e-cigarettes. If you need help quitting, ask your health care provider.  Contact your health care provider if you have any questions. Keep all prenatal visits as told by your health care provider. This  is important. This information is not intended to replace advice given to you by your health care provider. Make sure you discuss any questions you have with your health care provider. Document Released: 07/08/2001 Document Revised: 12/20/2015 Document Reviewed: 09/14/2012 Elsevier Interactive Patient Education  2017 [employer]

## 2018-03-22 NOTE — Progress Notes (Signed)
   LOW-RISK PREGNANCY VISIT Patient name: Brenda FerrierBobbie Carlson MRN [id number]  Date of birth: [birth year] Chief Complaint:   Routine Prenatal Visit (pn2)  History of Present Illness:   Brenda Carlson is a 22 y.o. G2P1001 female at 1313w4d with an Estimated Date of Delivery: 09/02/18 being seen today for ongoing management of a low-risk pregnancy.  Today she reports some constipation. Got our letter about abnormal pap. Contractions: Not present. Vag. Bleeding: None.  Movement: Present. denies leaking of fluid. Review of Systems:   Pertinent items are noted in HPI Denies abnormal vaginal discharge w/ itching/odor/irritation, headaches, visual changes, shortness of breath, chest pain, abdominal pain, severe nausea/vomiting, or problems with urination or bowel movements unless otherwise stated above. Pertinent History Reviewed:  Reviewed past medical,surgical, social, obstetrical and family history.  Reviewed problem list, medications and allergies. Physical Assessment:   Vitals:   03/22/18 1355  BP: 113/63  Pulse: 86  Weight: 154 lb (69.9 kg)  Body mass index is 24.12 kg/m.        Physical Examination:   General appearance: Well appearing, and in no distress  Mental status: Alert, oriented to person, place, and time  Skin: Warm & dry  Cardiovascular: Normal heart rate noted  Respiratory: Normal respiratory effort, no distress  Abdomen: Soft, gravid, nontender  Pelvic: Cervical exam deferred         Extremities: Edema: None  Fetal Status: Fetal Heart Rate (bpm): 154   Movement: Present    No results found for this or any previous visit (from the past 24 hour(s)).  Assessment & Plan:  1) Low-risk pregnancy G2P1001 at 1913w4d with an Estimated Date of Delivery: 09/02/18   2) ASCUS pap w/ -HRHPV, repeat 3951yr  3) Constipation> gave printed prevention/relief measures    Meds: No orders of the defined types were placed in this encounter.  Labs/procedures today: 2nd IT  Plan:  Continue routine  obstetrical care   Reviewed: Preterm labor symptoms and general obstetric precautions including but not limited to vaginal bleeding, contractions, leaking of fluid and fetal movement were reviewed in detail with the patient.  All questions were answered  Follow-up: Return in about 2 weeks (around 04/05/2018) for LROB, UX:LKGMWNUS:Anatomy.  Orders Placed This Encounter  Procedures  . US OB Comp + 14 Wk  . INTEGRATED 2  . POC Urinalysis Dipstick OB   Cheral MarkerKimberly R Nalany Steedley CNM, [facility]NP-BC 03/22/2018 2:10 PM

## 2018-03-24 LAB — INTEGRATED 2
ADSF: 0.55
AFP MOM: 1.07
Alpha-Fetoprotein: 34.2 ng/mL
CROWN RUMP LENGTH: 75.2 mm
DIA MoM: 1.11
DIA Value: 187 pg/mL
Estriol, Unconjugated: 0.49 ng/mL
GEST. AGE ON COLLECTION DATE: 13.4 wk
Gestational Age: 16.4 weeks
HCG MOM: 2
Maternal Age at EDD: 22.9 yr
NUCHAL TRANSLUCENCY MOM: 1.09
NUMBER OF FETUSES: 1
Nuchal Translucency (NT): 1.9 mm
PAPP-A MoM: 0.81
PAPP-A Value: 987.1 ng/mL
TEST RESULTS: NEGATIVE
WEIGHT: 154 [lb_av]
WEIGHT: 154 [lb_av]
hCG Value: 67.1 IU/mL

## 2018-04-08 ENCOUNTER — Ambulatory Visit (INDEPENDENT_AMBULATORY_CARE_PROVIDER_SITE_OTHER): Payer: Medicaid Other | Admitting: Obstetrics and Gynecology

## 2018-04-08 ENCOUNTER — Ambulatory Visit (INDEPENDENT_AMBULATORY_CARE_PROVIDER_SITE_OTHER): Payer: Medicaid Other

## 2018-04-08 VITALS — BP 122/67 | HR 55 | Wt 158.0 lb

## 2018-04-08 DIAGNOSIS — Z3482 Encounter for supervision of other normal pregnancy, second trimester: Secondary | ICD-10-CM

## 2018-04-08 DIAGNOSIS — Z1389 Encounter for screening for other disorder: Secondary | ICD-10-CM

## 2018-04-08 DIAGNOSIS — Z331 Pregnant state, incidental: Secondary | ICD-10-CM

## 2018-04-08 DIAGNOSIS — Z363 Encounter for antenatal screening for malformations: Secondary | ICD-10-CM

## 2018-04-08 DIAGNOSIS — Z3A19 19 weeks gestation of pregnancy: Secondary | ICD-10-CM

## 2018-04-08 LAB — POCT URINALYSIS DIPSTICK OB
Glucose, UA: NEGATIVE
NITRITE UA: NEGATIVE
RBC UA: NEGATIVE

## 2018-04-08 NOTE — Progress Notes (Signed)
US 19 wks,cephalic,cx 3.1 cm,anterior placenta gr 0,normal ovaries bilat,svp of fluid 5.1 cm,fhr 148 bpm,mild bilat renal pelvic dilatation RK 3.9 mm,LK 3.2 mm (WNL),efw 284 g 63%,anatomy complete,no obvious abnormalities

## 2018-04-08 NOTE — Progress Notes (Signed)
Patient ID: Brenda FerrierBobbie Carlson, female   DOB: 1996-01-16, 22 y.o.   MRN: [id number]   LOW-RISK PREGNANCY VISIT Patient name: Brenda Carlson MRN [id number]  Date of birth: 1996-01-16 Chief Complaint:   No chief complaint on file.  History of Present Illness:   Brenda Carlson is a 22 y.o. G2P1001 female at 6054w0d with an Estimated Date of Delivery: 09/02/18 being seen today for ongoing management of a low-risk pregnancy.  Today she reports no complaints. She is accompanied by her first child.    .  .   . denies leaking of fluid. Review of Systems:   Pertinent items are noted in HPI Denies abnormal vaginal discharge w/ itching/odor/irritation, headaches, visual changes, shortness of breath, chest pain, abdominal pain, severe nausea/vomiting, or problems with urination or bowel movements unless otherwise stated above. Pertinent History Reviewed:  Reviewed past medical,surgical, social, obstetrical and family history.  Reviewed problem list, medications and allergies. Physical Assessment:  There were no vitals filed for this visit.There is no height or weight on file to calculate BMI.        Physical Examination:   General appearance: Well appearing, and in no distress  Mental status: Alert, oriented to person, place, and time  Skin: Warm & dry  Cardiovascular: Normal heart rate noted  Respiratory: Normal respiratory effort, no distress  Abdomen: Soft, gravid, nontender  Pelvic: Cervical exam deferred         Extremities:    Fetal Status:          No results found for this or any previous visit (from the past 24 hour(s)).  Assessment & Plan:  1) Low-risk pregnancy G2P1001 at 754w0d with an Estimated Date of Delivery: 09/02/18   2) ASCUS pap w/ -HRHPV, repeat 1745yr   Plan:  Continue routine obstetrical care  Meds: No orders of the defined types were placed in this encounter.  Labs/procedures today: US 19 wks,cephalic,cx 3.1 cm,anterior placenta gr 0,normal ovaries bilat,svp of fluid 5.1 cm,fhr  148 bpm,mild bilat renal pelvic dilatation RK 3.9 mm,LK 3.2 mm (WNL),efw 284 g 63%,anatomy complete,no obvious abnormalities   Follow-up: Return in about 4 weeks (around 05/06/2018) for LROB.  No orders of the defined types were placed in this encounter.  By signing my name below, I, Pietro Cassismily Tufford, attest that this documentation has been prepared under the direction and in the presence of Tilda BurrowFerguson, Tandy Grawe V, MD. Electronically Signed: Pietro CassisEmily Tufford, Medical Scribe. 04/08/18. 8:49 AM. I personally performed the services described in this documentation, which was SCRIBED in my presence. The recorded information has been reviewed and considered accurate. It has been edited as necessary during review. Tilda BurrowJohn V Ylianna Almanzar, MD

## 2018-05-06 ENCOUNTER — Ambulatory Visit (INDEPENDENT_AMBULATORY_CARE_PROVIDER_SITE_OTHER): Payer: Medicaid Other | Admitting: Advanced Practice Midwife

## 2018-05-06 ENCOUNTER — Encounter: Payer: Self-pay | Admitting: Advanced Practice Midwife

## 2018-05-06 VITALS — BP 107/61 | HR 59 | Wt 161.4 lb

## 2018-05-06 DIAGNOSIS — Z1389 Encounter for screening for other disorder: Secondary | ICD-10-CM

## 2018-05-06 DIAGNOSIS — Z3482 Encounter for supervision of other normal pregnancy, second trimester: Secondary | ICD-10-CM

## 2018-05-06 DIAGNOSIS — Z331 Pregnant state, incidental: Secondary | ICD-10-CM

## 2018-05-06 DIAGNOSIS — Z3A23 23 weeks gestation of pregnancy: Secondary | ICD-10-CM

## 2018-05-06 LAB — POCT URINALYSIS DIPSTICK OB
Glucose, UA: NEGATIVE
KETONES UA: NEGATIVE
Leukocytes, UA: NEGATIVE
NITRITE UA: NEGATIVE
PROTEIN: NEGATIVE
RBC UA: NEGATIVE

## 2018-05-06 NOTE — Progress Notes (Signed)
  G2P1001 [redacted]w[redacted]d Estimated Date of Delivery: 09/02/18  Blood pressure 107/61, pulse (!) 59, weight 161 lb 6.4 oz (73.2 kg), last menstrual period 11/15/2017.   BP weight and urine results all reviewed and noted.  Please refer to the obstetrical flow sheet for the fundal height and fetal heart rate documentation:  Patient reports good fetal movement, denies any bleeding and no rupture of membranes symptoms or regular contractions. Patient is without complaints. All questions were answered.   Physical Assessment:   Vitals:   05/06/18 1416  BP: 107/61  Pulse: (!) 59  Weight: 161 lb 6.4 oz (73.2 kg)  Body mass index is 25.28 kg/m.        Physical Examination:   General appearance: Well appearing, and in no distress  Mental status: Alert, oriented to person, place, and time  Skin: Warm & dry  Cardiovascular: Normal heart rate noted  Respiratory: Normal respiratory effort, no distress  Abdomen: Soft, gravid, nontender  Pelvic: Cervical exam deferred         Extremities: Edema: None  Fetal Status: Fetal Heart Rate (bpm): 150   Movement: Present    Results for orders placed or performed in visit on 05/06/18 (from the past 24 hour(s))  POC Urinalysis Dipstick OB   Collection Time: 05/06/18  2:24 PM  Result Value Ref Range   Color, UA     Clarity, UA     Glucose, UA Negative Negative   Bilirubin, UA     Ketones, UA neg    Spec Grav, UA     Blood, UA neg    pH, UA     POC Protein UA Negative Negative, Trace   Urobilinogen, UA     Nitrite, UA neg    Leukocytes, UA Negative Negative   Appearance     Odor       Orders Placed This Encounter  Procedures  . POC Urinalysis Dipstick OB    Plan:  Continued routine obstetrical care,   Return in about 4 weeks (around 06/03/2018) for PN2/LROB.

## 2018-05-06 NOTE — Patient Instructions (Signed)

## 2018-06-03 ENCOUNTER — Encounter: Payer: Medicaid Other | Admitting: Obstetrics and Gynecology

## 2018-06-03 ENCOUNTER — Other Ambulatory Visit: Payer: Medicaid Other

## 2018-06-04 ENCOUNTER — Other Ambulatory Visit: Payer: Medicaid Other

## 2018-06-04 ENCOUNTER — Encounter: Payer: Self-pay | Admitting: Obstetrics & Gynecology

## 2018-06-04 ENCOUNTER — Ambulatory Visit (INDEPENDENT_AMBULATORY_CARE_PROVIDER_SITE_OTHER): Payer: Medicaid Other | Admitting: Obstetrics & Gynecology

## 2018-06-04 VITALS — BP 117/53 | HR 59 | Wt 170.0 lb

## 2018-06-04 DIAGNOSIS — Z3482 Encounter for supervision of other normal pregnancy, second trimester: Secondary | ICD-10-CM | POA: Diagnosis not present

## 2018-06-04 DIAGNOSIS — Z3A27 27 weeks gestation of pregnancy: Secondary | ICD-10-CM | POA: Diagnosis not present

## 2018-06-04 DIAGNOSIS — Z1389 Encounter for screening for other disorder: Secondary | ICD-10-CM

## 2018-06-04 DIAGNOSIS — Z331 Pregnant state, incidental: Secondary | ICD-10-CM

## 2018-06-04 LAB — POCT URINALYSIS DIPSTICK OB
GLUCOSE, UA: NEGATIVE
Ketones, UA: NEGATIVE
Nitrite, UA: NEGATIVE
POC,PROTEIN,UA: NEGATIVE
RBC UA: NEGATIVE

## 2018-06-04 NOTE — Progress Notes (Signed)
   LOW-RISK PREGNANCY VISIT Patient name: Brenda Carlson MRN [id number]  Date of birth: 03-18-1996 Chief Complaint:   Routine Prenatal Visit  History of Present Illness:   Brenda Carlson is a 22 y.o. G2P1001 female at [redacted]w[redacted]d with an Estimated Date of Delivery: 09/02/18 being seen today for ongoing management of a low-risk pregnancy.  Today she reports no complaints. Contractions: Not present. Vag. Bleeding: None.  Movement: Present. denies leaking of fluid. Review of Systems:   Pertinent items are noted in HPI Denies abnormal vaginal discharge w/ itching/odor/irritation, headaches, visual changes, shortness of breath, chest pain, abdominal pain, severe nausea/vomiting, or problems with urination or bowel movements unless otherwise stated above. Pertinent History Reviewed:  Reviewed past medical,surgical, social, obstetrical and family history.  Reviewed problem list, medications and allergies. Physical Assessment:   Vitals:   06/04/18 0917  BP: (!) 117/53  Pulse: (!) 59  Weight: 170 lb (77.1 kg)  Body mass index is 26.63 kg/m.        Physical Examination:   General appearance: Well appearing, and in no distress  Mental status: Alert, oriented to person, place, and time  Skin: Warm & dry  Cardiovascular: Normal heart rate noted  Respiratory: Normal respiratory effort, no distress  Abdomen: Soft, gravid, nontender  Pelvic: Cervical exam deferred         Extremities: Edema: None  Fetal Status:     Movement: Present    Results for orders placed or performed in visit on 06/04/18 (from the past 24 hour(s))  POC Urinalysis Dipstick OB   Collection Time: 06/04/18  9:21 AM  Result Value Ref Range   Color, UA     Clarity, UA     Glucose, UA Negative Negative   Bilirubin, UA     Ketones, UA neg    Spec Grav, UA     Blood, UA neg    pH, UA     POC,PROTEIN,UA Negative Negative, Trace   Urobilinogen, UA     Nitrite, UA neg    Leukocytes, UA Small (1+) (A) Negative   Appearance     Odor      Assessment & Plan:  1) Low-risk pregnancy G2P1001 at [redacted]w[redacted]d with an Estimated Date of Delivery: 09/02/18   2) B negative, Rhogam next week   Meds: No orders of the defined types were placed in this encounter.  Labs/procedures today: PN2  Plan:  Continue routine obstetrical care   Reviewed: Term labor symptoms and general obstetric precautions including but not limited to vaginal bleeding, contractions, leaking of fluid and fetal movement were reviewed in detail with the patient.  All questions were answered  Follow-up: Return in about 3 weeks (around 06/25/2018) for 1 week RhoGam, 3 weeks OB visit.  Orders Placed This Encounter  Procedures  . POC Urinalysis Dipstick OB   Lazaro Arms  06/04/2018 9:36 AM

## 2018-06-05 LAB — GLUCOSE TOLERANCE, 2 HOURS W/ 1HR
GLUCOSE, 2 HOUR: 94 mg/dL (ref 65–152)
Glucose, 1 hour: 112 mg/dL (ref 65–179)
Glucose, Fasting: 82 mg/dL (ref 65–91)

## 2018-06-05 LAB — HIV ANTIBODY (ROUTINE TESTING W REFLEX): HIV SCREEN 4TH GENERATION: NONREACTIVE

## 2018-06-05 LAB — CBC
HEMATOCRIT: 32.5 % — AB (ref 34.0–46.6)
Hemoglobin: 11.4 g/dL (ref 11.1–15.9)
MCH: 33.3 pg — ABNORMAL HIGH (ref 26.6–33.0)
MCHC: 35.1 g/dL (ref 31.5–35.7)
MCV: 95 fL (ref 79–97)
PLATELETS: 145 10*3/uL — AB (ref 150–450)
RBC: 3.42 x10E6/uL — ABNORMAL LOW (ref 3.77–5.28)
RDW: 13.2 % (ref 12.3–15.4)
WBC: 11.1 10*3/uL — AB (ref 3.4–10.8)

## 2018-06-05 LAB — RPR: RPR Ser Ql: NONREACTIVE

## 2018-06-05 LAB — ANTIBODY SCREEN: ANTIBODY SCREEN: NEGATIVE

## 2018-06-11 ENCOUNTER — Ambulatory Visit (INDEPENDENT_AMBULATORY_CARE_PROVIDER_SITE_OTHER): Payer: Medicaid Other

## 2018-06-11 VITALS — BP 128/73 | HR 89 | Ht 66.0 in | Wt 164.0 lb

## 2018-06-11 DIAGNOSIS — Z6791 Unspecified blood type, Rh negative: Principal | ICD-10-CM

## 2018-06-11 DIAGNOSIS — O360190 Maternal care for anti-D [Rh] antibodies, unspecified trimester, not applicable or unspecified: Secondary | ICD-10-CM | POA: Diagnosis not present

## 2018-06-11 DIAGNOSIS — O26892 Other specified pregnancy related conditions, second trimester: Secondary | ICD-10-CM

## 2018-06-11 DIAGNOSIS — Z331 Pregnant state, incidental: Secondary | ICD-10-CM

## 2018-06-11 DIAGNOSIS — Z1389 Encounter for screening for other disorder: Secondary | ICD-10-CM

## 2018-06-11 LAB — POCT URINALYSIS DIPSTICK OB
Glucose, UA: NEGATIVE
KETONES UA: NEGATIVE
LEUKOCYTES UA: NEGATIVE
Nitrite, UA: NEGATIVE
RBC UA: NEGATIVE

## 2018-06-11 MED ORDER — RHO D IMMUNE GLOBULIN 1500 UNIT/2ML IJ SOSY
300.0000 ug | PREFILLED_SYRINGE | Freq: Once | INTRAMUSCULAR | Status: AC
  Administered 2018-06-11: 300 ug via INTRAMUSCULAR

## 2018-06-11 NOTE — Progress Notes (Signed)
Pt here Rhogam 300 mcg, given rt VG..Tolerated well.B Negative blood.

## 2018-06-23 ENCOUNTER — Encounter: Payer: Medicaid Other | Admitting: Women's Health

## 2018-06-23 ENCOUNTER — Ambulatory Visit (INDEPENDENT_AMBULATORY_CARE_PROVIDER_SITE_OTHER): Payer: Medicaid Other | Admitting: Women's Health

## 2018-06-23 ENCOUNTER — Encounter: Payer: Self-pay | Admitting: Women's Health

## 2018-06-23 VITALS — BP 121/66 | HR 66 | Wt 170.5 lb

## 2018-06-23 DIAGNOSIS — Z3A29 29 weeks gestation of pregnancy: Secondary | ICD-10-CM | POA: Diagnosis not present

## 2018-06-23 DIAGNOSIS — O26899 Other specified pregnancy related conditions, unspecified trimester: Secondary | ICD-10-CM

## 2018-06-23 DIAGNOSIS — Z3483 Encounter for supervision of other normal pregnancy, third trimester: Secondary | ICD-10-CM

## 2018-06-23 DIAGNOSIS — Z23 Encounter for immunization: Secondary | ICD-10-CM

## 2018-06-23 DIAGNOSIS — Z1389 Encounter for screening for other disorder: Secondary | ICD-10-CM

## 2018-06-23 DIAGNOSIS — Z331 Pregnant state, incidental: Secondary | ICD-10-CM

## 2018-06-23 DIAGNOSIS — Z6791 Unspecified blood type, Rh negative: Secondary | ICD-10-CM

## 2018-06-23 LAB — POCT URINALYSIS DIPSTICK OB
Glucose, UA: NEGATIVE
KETONES UA: NEGATIVE
Leukocytes, UA: NEGATIVE
NITRITE UA: NEGATIVE
PROTEIN: NEGATIVE
RBC UA: NEGATIVE

## 2018-06-23 NOTE — Progress Notes (Signed)
   LOW-RISK PREGNANCY VISIT Patient name: Brenda FerrierBobbie Carlson MRN [id number]  Date of birth: 11-20-1995 Chief Complaint:   Routine Prenatal Visit  History of Present Illness:   Brenda Carlson is a 22 y.o. G2P1001 female at 840w6d with an Estimated Date of Delivery: 09/02/18 being seen today for ongoing management of a low-risk pregnancy.  Today she reports no complaints. Still smoking THC, wants to know when to quit. Contractions: Not present. Vag. Bleeding: None.  Movement: Present. denies leaking of fluid. Review of Systems:   Pertinent items are noted in HPI Denies abnormal vaginal discharge w/ itching/odor/irritation, headaches, visual changes, shortness of breath, chest pain, abdominal pain, severe nausea/vomiting, or problems with urination or bowel movements unless otherwise stated above. Pertinent History Reviewed:  Reviewed past medical,surgical, social, obstetrical and family history.  Reviewed problem list, medications and allergies. Physical Assessment:   Vitals:   06/23/18 0840  BP: 121/66  Pulse: 66  Weight: 170 lb 8 oz (77.3 kg)  Body mass index is 27.52 kg/m.        Physical Examination:   General appearance: Well appearing, and in no distress  Mental status: Alert, oriented to person, place, and time  Skin: Warm & dry  Cardiovascular: Normal heart rate noted  Respiratory: Normal respiratory effort, no distress  Abdomen: Soft, gravid, nontender  Pelvic: Cervical exam deferred         Extremities: Edema: None  Fetal Status: Fetal Heart Rate (bpm): 138 Fundal Height: 28 cm Movement: Present     Urine dipstick: neg  No results found for this or any previous visit (from the past 24 hour(s)).  Assessment & Plan:  1) Low-risk pregnancy G2P1001 at 8240w6d with an Estimated Date of Delivery: 09/02/18   2) THC use, advised cessation now, discussed potential long-term effects to baby   Meds: No orders of the defined types were placed in this encounter.  Labs/procedures today:  tdap, flu shots  Plan:  Continue routine obstetrical care   Reviewed: Preterm labor symptoms and general obstetric precautions including but not limited to vaginal bleeding, contractions, leaking of fluid and fetal movement were reviewed in detail with the patient.  All questions were answered  Follow-up: Return in about 2 weeks (around 07/07/2018) for LROB.  Orders Placed This Encounter  Procedures  . Tdap vaccine greater than or equal to 7yo IM  . POC Urinalysis Dipstick OB   Cheral MarkerKimberly R Booker CNM, [facility]NP[facility] 06/23/2018 9:02 AM

## 2018-06-23 NOTE — Patient Instructions (Addendum)
Brenda Carlson, I greatly value your feedback.  If you receive a survey following your visit with [place] today, we appreciate you taking the time to fill it out.  Thanks, [name], CNM, WHNP-BC  Tips to Help You Sleep Better:   Get into a bedtime routine, try to do the same thing every night before going to bed to try to help your body wind down  Warm baths  Avoid caffeine for at least 3 hours before going to sleep   Keep your room at a slightly cooler temperature, can try running a fan  Turn off TV, lights, phone, electronics  Lots of pillows if needed to help you get comfortable  Lavender scented items can help you sleep. You can place lavender essential oil on a cotton ball and place under your pillowcase, or place in a diffuser. [employer] has a lavender scented sleep line (plug-ins, sprays, etc). Look in the pillow aisle for lavender scented pillows.   If none of the above things help, you can try 1/2 to 1 tablet of benadryl, unisom, or tylenol pm. Do not take this every night, only when you really need it.     Call the office [phone]) or go to [facility] if:  You begin to have strong, frequent contractions  Your water breaks.  Sometimes it is a big gush of fluid, sometimes it is just a trickle that keeps getting your panties wet or running down your legs  You have vaginal bleeding.  It is normal to have a small amount of spotting if your cervix was checked.   You don't feel your baby moving like normal.  If you don't, get you something to eat and drink and lay down and focus on feeling your baby move.  You should feel at least 10 movements in 2 hours.  If you don't, you should call the office or go to [facility].    Tdap Vaccine  It is recommended that you get the Tdap vaccine during the third trimester of EACH pregnancy to help protect your baby from getting pertussis (whooping cough)  27-36 weeks is the BEST time to do this so that you can pass the protection on  to your baby. During pregnancy is better than after pregnancy, but if you are unable to get it during pregnancy it will be offered at the hospital.   You can get this vaccine with [place], at the health department, your family doctor, or some local pharmacies  Everyone who will be around your baby should also be up-to-date on their vaccines before the baby comes. Adults (who are not pregnant) only need 1 dose of Tdap during adulthood.   Third Trimester of Pregnancy The third trimester is from week 29 through week 42, months 7 through 9. The third trimester is a time when the fetus is growing rapidly. At the end of the ninth month, the fetus is about 20 inches in length and weighs 6-10 pounds.  BODY CHANGES Your body goes through many changes during pregnancy. The changes vary from woman to woman.   Your weight will continue to increase. You can expect to gain 25-35 pounds (11-16 kg) by the end of the pregnancy.  You may begin to get stretch marks on your hips, abdomen, and breasts.  You may urinate more often because the fetus is moving lower into your pelvis and pressing on your bladder.  You may develop or continue to have heartburn as a result of your pregnancy.  You  may develop constipation because certain hormones are causing the muscles that push waste through your intestines to slow down.  You may develop hemorrhoids or swollen, bulging veins (varicose veins).  You may have pelvic pain because of the weight gain and pregnancy hormones relaxing your joints between the bones in your pelvis. Backaches may result from overexertion of the muscles supporting your posture.  You may have changes in your hair. These can include thickening of your hair, rapid growth, and changes in texture. Some women also have hair loss during or after pregnancy, or hair that feels dry or thin. Your hair will most likely return to normal after your baby is born.  Your breasts will continue to grow and be tender.  A yellow discharge may leak from your breasts called colostrum.  Your belly button may stick out.  You may feel short of breath because of your expanding uterus.  You may notice the fetus "dropping," or moving lower in your abdomen.  You may have a bloody mucus discharge. This usually occurs a few days to a week before labor begins.  Your cervix becomes thin and soft (effaced) near your due date. WHAT TO EXPECT AT YOUR PRENATAL EXAMS  You will have prenatal exams every 2 weeks until week 36. Then, you will have weekly prenatal exams. During a routine prenatal visit:  You will be weighed to make sure you and the fetus are growing normally.  Your blood pressure is taken.  Your abdomen will be measured to track your baby's growth.  The fetal heartbeat will be listened to.  Any test results from the previous visit will be discussed.  You may have a cervical check near your due date to see if you have effaced. At around 36 weeks, your caregiver will check your cervix. At the same time, your caregiver will also perform a test on the secretions of the vaginal tissue. This test is to determine if a type of bacteria, Group B streptococcus, is present. Your caregiver will explain this further. Your caregiver may ask you:  What your birth plan is.  How you are feeling.  If you are feeling the baby move.  If you have had any abnormal symptoms, such as leaking fluid, bleeding, severe headaches, or abdominal cramping.  If you have any questions. Other tests or screenings that may be performed during your third trimester include:  Blood tests that check for low iron levels (anemia).  Fetal testing to check the health, activity level, and growth of the fetus. Testing is done if you have certain medical conditions or if there are problems during the pregnancy. FALSE LABOR You may feel small, irregular contractions that eventually go away. These are called Braxton Hicks contractions, or  false labor. Contractions may last for hours, days, or even weeks before true labor sets in. If contractions come at regular intervals, intensify, or become painful, it is best to be seen by your caregiver.  SIGNS OF LABOR   Menstrual-like cramps.  Contractions that are 5 minutes apart or less.  Contractions that start on the top of the uterus and spread down to the lower abdomen and back.  A sense of increased pelvic pressure or back pain.  A watery or bloody mucus discharge that comes from the vagina. If you have any of these signs before the 37th week of pregnancy, call your caregiver right away. You need to go to the hospital to get checked immediately. HOME CARE INSTRUCTIONS   Avoid all smoking,  herbs, alcohol, and unprescribed drugs. These chemicals affect the formation and growth of the baby.  Follow your caregiver's instructions regarding medicine use. There are medicines that are either safe or unsafe to take during pregnancy.  Exercise only as directed by your caregiver. Experiencing uterine cramps is a good sign to stop exercising.  Continue to eat regular, healthy meals.  Wear a good support bra for breast tenderness.  Do not use hot tubs, steam rooms, or saunas.  Wear your seat belt at all times when driving.  Avoid raw meat, uncooked cheese, cat litter boxes, and soil used by cats. These carry germs that can cause birth defects in the baby.  Take your prenatal vitamins.  Try taking a stool softener (if your caregiver approves) if you develop constipation. Eat more high-fiber foods, such as fresh vegetables or fruit and whole grains. Drink plenty of fluids to keep your urine clear or pale yellow.  Take warm sitz baths to soothe any pain or discomfort caused by hemorrhoids. Use hemorrhoid cream if your caregiver approves.  If you develop varicose veins, wear support hose. Elevate your feet for 15 minutes, 3-4 times a day. Limit salt in your diet.  Avoid heavy  lifting, wear low heal shoes, and practice good posture.  Rest a lot with your legs elevated if you have leg cramps or low back pain.  Visit your dentist if you have not gone during your pregnancy. Use a soft toothbrush to brush your teeth and be gentle when you floss.  A sexual relationship may be continued unless your caregiver directs you otherwise.  Do not travel far distances unless it is absolutely necessary and only with the approval of your caregiver.  Take prenatal classes to understand, practice, and ask questions about the labor and delivery.  Make a trial run to the hospital.  Pack your hospital bag.  Prepare the baby's nursery.  Continue to go to all your prenatal visits as directed by your caregiver. SEEK MEDICAL CARE IF:  You are unsure if you are in labor or if your water has broken.  You have dizziness.  You have mild pelvic cramps, pelvic pressure, or nagging pain in your abdominal area.  You have persistent nausea, vomiting, or diarrhea.  You have a bad smelling vaginal discharge.  You have pain with urination. SEEK IMMEDIATE MEDICAL CARE IF:   You have a fever.  You are leaking fluid from your vagina.  You have spotting or bleeding from your vagina.  You have severe abdominal cramping or pain.  You have rapid weight loss or gain.  You have shortness of breath with chest pain.  You notice sudden or extreme swelling of your face, hands, ankles, feet, or legs.  You have not felt your baby move in over an hour.  You have severe headaches that do not go away with medicine.  You have vision changes. Document Released: 07/08/2001 Document Revised: 07/19/2013 Document Reviewed: 09/14/2012 [facility] Patient Information 2015 [place], [place]. This information is not intended to replace advice given to you by your health care provider. Make sure you discuss any questions you have with your health care provider.

## 2018-06-30 ENCOUNTER — Telehealth: Payer: Self-pay | Admitting: *Deleted

## 2018-06-30 NOTE — Telephone Encounter (Signed)
Patient states since getting the flu shot on Friday she has been experiencing a cough, congestion, etc and wants to know what she can take.  Advised patient she could take mucinex, cough drops, cough medicine as long as it was alcohol free.  Resent babyscripts enrollment for patient to activate as well.

## 2018-07-07 ENCOUNTER — Ambulatory Visit (INDEPENDENT_AMBULATORY_CARE_PROVIDER_SITE_OTHER): Payer: Medicaid Other | Admitting: Women's Health

## 2018-07-07 ENCOUNTER — Encounter: Payer: Self-pay | Admitting: Women's Health

## 2018-07-07 VITALS — BP 135/70 | HR 65 | Wt 167.0 lb

## 2018-07-07 DIAGNOSIS — O35EXX0 Maternal care for other (suspected) fetal abnormality and damage, fetal genitourinary anomalies, not applicable or unspecified: Secondary | ICD-10-CM

## 2018-07-07 DIAGNOSIS — Z1389 Encounter for screening for other disorder: Secondary | ICD-10-CM

## 2018-07-07 DIAGNOSIS — Z3A31 31 weeks gestation of pregnancy: Secondary | ICD-10-CM

## 2018-07-07 DIAGNOSIS — Z331 Pregnant state, incidental: Secondary | ICD-10-CM

## 2018-07-07 DIAGNOSIS — O358XX0 Maternal care for other (suspected) fetal abnormality and damage, not applicable or unspecified: Secondary | ICD-10-CM

## 2018-07-07 DIAGNOSIS — O26843 Uterine size-date discrepancy, third trimester: Secondary | ICD-10-CM

## 2018-07-07 DIAGNOSIS — Z3483 Encounter for supervision of other normal pregnancy, third trimester: Secondary | ICD-10-CM

## 2018-07-07 LAB — POCT URINALYSIS DIPSTICK OB
Glucose, UA: NEGATIVE
Ketones, UA: NEGATIVE
Nitrite, UA: NEGATIVE
PROTEIN: NEGATIVE

## 2018-07-07 NOTE — Progress Notes (Signed)
   LOW-RISK PREGNANCY VISIT Patient name: Brenda FerrierBobbie Carlson MRN [id number]  Date of birth: 1996/07/10 Chief Complaint:   Routine Prenatal Visit  History of Present Illness:   Brenda Carlson is a 22 y.o. G2P1001 female at 6937w6d with an Estimated Date of Delivery: 09/02/18 being seen today for ongoing management of a low-risk pregnancy.  Today she reports no complaints. Contractions: Not present. Vag. Bleeding: None.  Movement: Present. denies leaking of fluid. Review of Systems:   Pertinent items are noted in HPI Denies abnormal vaginal discharge w/ itching/odor/irritation, headaches, visual changes, shortness of breath, chest pain, abdominal pain, severe nausea/vomiting, or problems with urination or bowel movements unless otherwise stated above. Pertinent History Reviewed:  Reviewed past medical,surgical, social, obstetrical and family history.  Reviewed problem list, medications and allergies. Physical Assessment:   Vitals:   07/07/18 0836  BP: 135/70  Pulse: 65  Weight: 167 lb (75.8 kg)  Body mass index is 26.95 kg/m.        Physical Examination:   General appearance: Well appearing, and in no distress  Mental status: Alert, oriented to person, place, and time  Skin: Warm & dry  Cardiovascular: Normal heart rate noted  Respiratory: Normal respiratory effort, no distress  Abdomen: Soft, gravid, nontender  Pelvic: Cervical exam deferred         Extremities: Edema: None  Fetal Status: Fetal Heart Rate (bpm): 140 Fundal Height: 28 cm Movement: Present    Results for orders placed or performed in visit on 07/07/18 (from the past 24 hour(s))  POC Urinalysis Dipstick OB   Collection Time: 07/07/18  8:37 AM  Result Value Ref Range   Color, UA     Clarity, UA     Glucose, UA Negative Negative   Bilirubin, UA     Ketones, UA neg    Spec Grav, UA     Blood, UA trace    pH, UA     POC,PROTEIN,UA Negative Negative, Trace, Small (1+), Moderate (2+), Large (3+), 4+   Urobilinogen, UA      Nitrite, UA neg    Leukocytes, UA Moderate (2+) (A) Negative   Appearance     Odor      Assessment & Plan:  1) Low-risk pregnancy G2P1001 at 2237w6d with an Estimated Date of Delivery: 09/02/18   2) Uterine size <dates, will get efw/afi u/s, fetus also had mild RPD @ anatomy u/s   Meds: No orders of the defined types were placed in this encounter.  Labs/procedures today: none  Plan:  Continue routine obstetrical care   Reviewed: Preterm labor symptoms and general obstetric precautions including but not limited to vaginal bleeding, contractions, leaking of fluid and fetal movement were reviewed in detail with the patient.  All questions were answered  Follow-up: Return for asap efw u/s (no visit), then 2wks LROB.  Orders Placed This Encounter  Procedures  . US OB Follow Up  . POC Urinalysis Dipstick OB   Cheral MarkerKimberly R Gerldine Suleiman CNM, [facility]NP-BC 07/07/2018 9:01 AM

## 2018-07-07 NOTE — Patient Instructions (Signed)
Lynnea FerrierBobbie Robson, I greatly value your feedback.  If you receive a survey following your visit with us today, we appreciate you taking the time to fill it out.  Thanks, Joellyn HaffKim Julieth Tugman, CNM, WHNP-BC   Call the office [phone]) or go to [facility] if:  You begin to have strong, frequent contractions  Your water breaks.  Sometimes it is a big gush of fluid, sometimes it is just a trickle that keeps getting your panties wet or running down your legs  You have vaginal bleeding.  It is normal to have a small amount of spotting if your cervix was checked.   You don't feel your baby moving like normal.  If you don't, get you something to eat and drink and lay down and focus on feeling your baby move.  You should feel at least 10 movements in 2 hours.  If you don't, you should call the office or [facility].    Tdap Vaccine  It is recommended that you get the Tdap vaccine during the third trimester of EACH pregnancy to help protect your baby from getting pertussis (whooping cough)  27-36 weeks is the BEST time to do this so that you can pass the protection on to your baby. During pregnancy is better than after pregnancy, but if you are unable to get it during pregnancy it will be offered at the hospital.   You can get this vaccine with us, at the health department, your family doctor, or some local pharmacies  Everyone who will be around your baby should also be up-to-date on their vaccines before the baby comes. Adults (who are not pregnant) only need 1 dose of Tdap during adulthood.   Third Trimester of Pregnancy The third trimester is from week 29 through week 42, months 7 through 9. The third trimester is a time when the fetus is growing rapidly. At the end of the ninth month, the fetus is about 20 inches in length and weighs 6-10 pounds.  BODY CHANGES Your body goes through many changes during pregnancy. The changes vary from woman to woman.   Your weight will continue to  increase. You can expect to gain 25-35 pounds (11-16 kg) by the end of the pregnancy.  You may begin to get stretch marks on your hips, abdomen, and breasts.  You may urinate more often because the fetus is moving lower into your pelvis and pressing on your bladder.  You may develop or continue to have heartburn as a result of your pregnancy.  You may develop constipation because certain hormones are causing the muscles that push waste through your intestines to slow down.  You may develop hemorrhoids or swollen, bulging veins (varicose veins).  You may have pelvic pain because of the weight gain and pregnancy hormones relaxing your joints between the bones in your pelvis. Backaches may result from overexertion of the muscles supporting your posture.  You may have changes in your hair. These can include thickening of your hair, rapid growth, and changes in texture. Some women also have hair loss during or after pregnancy, or hair that feels dry or thin. Your hair will most likely return to normal after your baby is born.  Your breasts will continue to grow and be tender. A yellow discharge may leak from your breasts called colostrum.  Your belly button may stick out.  You may feel short of breath because of your expanding uterus.  You may notice the fetus "dropping," or moving lower in  your abdomen.  You may have a bloody mucus discharge. This usually occurs a few days to a week before labor begins.  Your cervix becomes thin and soft (effaced) near your due date. WHAT TO EXPECT AT YOUR PRENATAL EXAMS  You will have prenatal exams every 2 weeks until week 36. Then, you will have weekly prenatal exams. During a routine prenatal visit:  You will be weighed to make sure you and the fetus are growing normally.  Your blood pressure is taken.  Your abdomen will be measured to track your baby's growth.  The fetal heartbeat will be listened to.  Any test results from the previous visit  will be discussed.  You may have a cervical check near your due date to see if you have effaced. At around 36 weeks, your caregiver will check your cervix. At the same time, your caregiver will also perform a test on the secretions of the vaginal tissue. This test is to determine if a type of bacteria, Group B streptococcus, is present. Your caregiver will explain this further. Your caregiver may ask you:  What your birth plan is.  How you are feeling.  If you are feeling the baby move.  If you have had any abnormal symptoms, such as leaking fluid, bleeding, severe headaches, or abdominal cramping.  If you have any questions. Other tests or screenings that may be performed during your third trimester include:  Blood tests that check for low iron levels (anemia).  Fetal testing to check the health, activity level, and growth of the fetus. Testing is done if you have certain medical conditions or if there are problems during the pregnancy. FALSE LABOR You may feel small, irregular contractions that eventually go away. These are called Braxton Hicks contractions, or false labor. Contractions may last for hours, days, or even weeks before true labor sets in. If contractions come at regular intervals, intensify, or become painful, it is best to be seen by your caregiver.  SIGNS OF LABOR   Menstrual-like cramps.  Contractions that are 5 minutes apart or less.  Contractions that start on the top of the uterus and spread down to the lower abdomen and back.  A sense of increased pelvic pressure or back pain.  A watery or bloody mucus discharge that comes from the vagina. If you have any of these signs before the 37th week of pregnancy, call your caregiver right away. You need to go to the hospital to get checked immediately. HOME CARE INSTRUCTIONS   Avoid all smoking, herbs, alcohol, and unprescribed drugs. These chemicals affect the formation and growth of the baby.  Follow your  caregiver's instructions regarding medicine use. There are medicines that are either safe or unsafe to take during pregnancy.  Exercise only as directed by your caregiver. Experiencing uterine cramps is a good sign to stop exercising.  Continue to eat regular, healthy meals.  Wear a good support bra for breast tenderness.  Do not use hot tubs, steam rooms, or saunas.  Wear your seat belt at all times when driving.  Avoid raw meat, uncooked cheese, cat litter boxes, and soil used by cats. These carry germs that can cause birth defects in the baby.  Take your prenatal vitamins.  Try taking a stool softener (if your caregiver approves) if you develop constipation. Eat more high-fiber foods, such as fresh vegetables or fruit and whole grains. Drink plenty of fluids to keep your urine clear or pale yellow.  Take warm sitz baths to  soothe any pain or discomfort caused by hemorrhoids. Use hemorrhoid cream if your caregiver approves.  If you develop varicose veins, wear support hose. Elevate your feet for 15 minutes, 3-4 times a day. Limit salt in your diet.  Avoid heavy lifting, wear low heal shoes, and practice good posture.  Rest a lot with your legs elevated if you have leg cramps or low back pain.  Visit your dentist if you have not gone during your pregnancy. Use a soft toothbrush to brush your teeth and be gentle when you floss.  A sexual relationship may be continued unless your caregiver directs you otherwise.  Do not travel far distances unless it is absolutely necessary and only with the approval of your caregiver.  Take prenatal classes to understand, practice, and ask questions about the labor and delivery.  Make a trial run to the hospital.  Pack your hospital bag.  Prepare the baby's nursery.  Continue to go to all your prenatal visits as directed by your caregiver. SEEK MEDICAL CARE IF:  You are unsure if you are in labor or if your water has broken.  You have  dizziness.  You have mild pelvic cramps, pelvic pressure, or nagging pain in your abdominal area.  You have persistent nausea, vomiting, or diarrhea.  You have a bad smelling vaginal discharge.  You have pain with urination. SEEK IMMEDIATE MEDICAL CARE IF:   You have a fever.  You are leaking fluid from your vagina.  You have spotting or bleeding from your vagina.  You have severe abdominal cramping or pain.  You have rapid weight loss or gain.  You have shortness of breath with chest pain.  You notice sudden or extreme swelling of your face, hands, ankles, feet, or legs.  You have not felt your baby move in over an hour.  You have severe headaches that do not go away with medicine.  You have vision changes. Document Released: 07/08/2001 Document Revised: 07/19/2013 Document Reviewed: 09/14/2012 [facility] Patient Information 2015 [place], [place]. This information is not intended to replace advice given to you by your health care provider. Make sure you discuss any questions you have with your health care provider.

## 2018-07-13 ENCOUNTER — Ambulatory Visit (INDEPENDENT_AMBULATORY_CARE_PROVIDER_SITE_OTHER): Payer: Medicaid Other

## 2018-07-13 DIAGNOSIS — O26843 Uterine size-date discrepancy, third trimester: Secondary | ICD-10-CM

## 2018-07-13 DIAGNOSIS — O358XX0 Maternal care for other (suspected) fetal abnormality and damage, not applicable or unspecified: Secondary | ICD-10-CM

## 2018-07-13 DIAGNOSIS — O35EXX0 Maternal care for other (suspected) fetal abnormality and damage, fetal genitourinary anomalies, not applicable or unspecified: Secondary | ICD-10-CM

## 2018-07-13 DIAGNOSIS — Z3483 Encounter for supervision of other normal pregnancy, third trimester: Secondary | ICD-10-CM

## 2018-07-13 NOTE — Progress Notes (Signed)
US 32+5 wks,cephalic,cx 3.5 cm,anterior placenta gr 1,normal ovaries bilat,fhr 135 bpm,left renal pelvis 4.8 mm,right renal pelvis 3.2 mm (wnl),AFI 16.6 cm,EFW 2036 g 41%

## 2018-07-22 ENCOUNTER — Ambulatory Visit (INDEPENDENT_AMBULATORY_CARE_PROVIDER_SITE_OTHER): Payer: Medicaid Other | Admitting: Women's Health

## 2018-07-22 ENCOUNTER — Encounter: Payer: Self-pay | Admitting: Women's Health

## 2018-07-22 VITALS — BP 127/69 | HR 64 | Wt 179.0 lb

## 2018-07-22 DIAGNOSIS — R600 Localized edema: Secondary | ICD-10-CM

## 2018-07-22 DIAGNOSIS — Z3483 Encounter for supervision of other normal pregnancy, third trimester: Secondary | ICD-10-CM

## 2018-07-22 DIAGNOSIS — Z3A34 34 weeks gestation of pregnancy: Secondary | ICD-10-CM

## 2018-07-22 NOTE — Patient Instructions (Signed)
Brenda Carlson, I greatly value your feedback.  If you receive a survey following your visit with us today, we appreciate you taking the time to fill it out.  Thanks, Joellyn HaffKim Booker, CNM, WHNP-BC   Call the office [phone]) or go to [facility] if:  You begin to have strong, frequent contractions  Your water breaks.  Sometimes it is a big gush of fluid, sometimes it is just a trickle that keeps getting your panties wet or running down your legs  You have vaginal bleeding.  It is normal to have a small amount of spotting if your cervix was checked.   You don't feel your baby moving like normal.  If you don't, get you something to eat and drink and lay down and focus on feeling your baby move.  You should feel at least 10 movements in 2 hours.  If you don't, you should call the office or go to [facility].     Preterm Labor and Birth Information  The normal length of a pregnancy is 39-41 weeks. Preterm labor is when labor starts before 37 completed weeks of pregnancy. What are the risk factors for preterm labor? Preterm labor is more likely to occur in women who:  Have certain infections during pregnancy such as a bladder infection, sexually transmitted infection, or infection inside the uterus (chorioamnionitis).  Have a shorter-than-normal cervix.  Have gone into preterm labor before.  Have had surgery on their cervix.  Are younger than age 917 or older than age 22.  Are African American.  Are pregnant with twins or multiple babies (multiple gestation).  Take street drugs or smoke while pregnant.  Do not gain enough weight while pregnant.  Became pregnant shortly after having been pregnant. What are the symptoms of preterm labor? Symptoms of preterm labor include:  Cramps similar to those that can happen during a menstrual period. The cramps may happen with diarrhea.  Pain in the abdomen or lower back.  Regular uterine contractions that may feel like tightening of  the abdomen.  A feeling of increased pressure in the pelvis.  Increased watery or bloody mucus discharge from the vagina.  Water breaking (ruptured amniotic sac). Why is it important to recognize signs of preterm labor? It is important to recognize signs of preterm labor because babies who are born prematurely may not be fully developed. This can put them at an increased risk for:  Long-term (chronic) heart and lung problems.  Difficulty immediately after birth with regulating body systems, including blood sugar, body temperature, heart rate, and breathing rate.  Bleeding in the brain.  Cerebral palsy.  Learning difficulties.  Death. These risks are highest for babies who are born before 34 weeks of pregnancy. How is preterm labor treated? Treatment depends on the length of your pregnancy, your condition, and the health of your baby. It may involve:  Having a stitch (suture) placed in your cervix to prevent your cervix from opening too early (cerclage).  Taking or being given medicines, such as: ? Hormone medicines. These may be given early in pregnancy to help support the pregnancy. ? Medicine to stop contractions. ? Medicines to help mature the baby's lungs. These may be prescribed if the risk of delivery is high. ? Medicines to prevent your baby from developing cerebral palsy. If the labor happens before 34 weeks of pregnancy, you may need to stay in the hospital. What should I do if I think I am in preterm labor? If you think that you  are going into preterm labor, call your health care provider right away. How can I prevent preterm labor in future pregnancies? To increase your chance of having a full-term pregnancy:  Do not use any tobacco products, such as cigarettes, chewing tobacco, and e-cigarettes. If you need help quitting, ask your health care provider.  Do not use street drugs or medicines that have not been prescribed to you during your pregnancy.  Talk with  your health care provider before taking any herbal supplements, even if you have been taking them regularly.  Make sure you gain a healthy amount of weight during your pregnancy.  Watch for infection. If you think that you might have an infection, get it checked right away.  Make sure to tell your health care provider if you have gone into preterm labor before. This information is not intended to replace advice given to you by your health care provider. Make sure you discuss any questions you have with your health care provider. Document Released: 10/04/2003 Document Revised: 12/25/2015 Document Reviewed: 12/05/2015 Elsevier Interactive Patient Education  2019 [employer]

## 2018-07-22 NOTE — Progress Notes (Signed)
   LOW-RISK PREGNANCY VISIT Patient name: Brenda Carlson MRN [id number]  Date of birth: 11-14-1995 Chief Complaint:   work-in-ob (swelling in face)  History of Present Illness:   Brenda Carlson is a 22 y.o. G2P1001 female at 977w0d with an Estimated Date of Delivery: 09/02/18 being seen today for ongoing management of a low-risk pregnancy.  Today she reports Woke up this am and face was swollen, felt like her throat was getting tight- feels fine now. Didn't eat anything yesterday out of the ordinary, states she had it all before. No seafood.  Had some vomiting this am, none now. Contractions: Irregular.  .  Movement: Present. denies leaking of fluid. Review of Systems:   Pertinent items are noted in HPI Denies abnormal vaginal discharge w/ itching/odor/irritation, headaches, visual changes, shortness of breath, chest pain, abdominal pain, severe nausea/vomiting, or problems with urination or bowel movements unless otherwise stated above. Pertinent History Reviewed:  Reviewed past medical,surgical, social, obstetrical and family history.  Reviewed problem list, medications and allergies. Physical Assessment:   Vitals:   07/22/18 0914  BP: 127/69  Pulse: 64  Weight: 179 lb (81.2 kg)  Body mass index is 28.89 kg/m.        Physical Examination:   General appearance: Well appearing, and in no distress  Mental status: Alert, oriented to person, place, and time  Skin: W:arm & dry  Face: appears slightly edematous all over, nothing excessive  Cardiovascular: Normal heart rate noted  Respiratory: Normal respiratory effort, no distress  Abdomen: Soft, gravid, nontender  Pelvic: Cervical exam deferred         Extremities: Edema: Trace  Fetal Status: Fetal Heart Rate (bpm): 148 Fundal Height: 32 cm Movement: Present    No results found for this or any previous visit (from the past 24 hour(s)).  Assessment & Plan:  1) Low-risk pregnancy G2P1001 at 3377w0d with an Estimated Date of Delivery:  09/02/18   2) Facial edema> BP normal so pre-e low suspicion. Likely allergic reaction, to try benadryl to help. If becomes sob, throat feels like it starts to swell etc- go to ED. Work note given for today. To write down everything she ate yesterday to keep a log in case happens again.   Meds: No orders of the defined types were placed in this encounter.  Labs/procedures today: none  Plan:  Continue routine obstetrical care   Reviewed: Preterm labor symptoms and general obstetric precautions including but not limited to vaginal bleeding, contractions, leaking of fluid and fetal movement were reviewed in detail with the patient.  All questions were answered  Follow-up: Return in about 2 weeks (around 08/05/2018) for LROB.  No orders of the defined types were placed in this encounter.  Cheral MarkerKimberly R Latrice Storlie CNM, [facility] 07/22/2018 9:47 AM

## 2018-07-28 NOTE — L&D Delivery Note (Signed)
Patient: Brenda Carlson MRN: [id number]  GBS status: negative   Patient is a 23 y.o. now G2P2002 s/p NSVD at [redacted]w[redacted]d, who was admitted for SOL. SROM 0h 59m prior to delivery with clear fluid.    Delivery Note At 1:54 AM a viable female was delivered via Vaginal, Spontaneous (Presentation: OA, restituted LOA).  APGAR: 8, 8; weight pending.   Placenta status: intact, spontaneous, .  Cord: 3 vessel with the following complications nuchal X2, not reducible, delivered through somersault and reduced afterwards.   Anesthesia:  Local lidocaine for repair  Episiotomy: None Lacerations: 1st degree Suture Repair: 4-0 monocryl sh  Est. Blood Loss (mL): 375  Mom to postpartum.  Baby to Couplet care / Skin to Skin.  Brenda Carlson Brenda Carlson 08/23/2018, 2:30 AM   Head delivered LOA. nuchal cord X2 present. Shoulder and body delivered in usual fashion. Infant with spontaneous cry, placed on mother's abdomen, dried and bulb suctioned. Cord clamped x 2 after 1-minute delay, and cut by family member. Cord blood drawn. Placenta delivered spontaneously with gentle cord traction. Fundus firm with massage and Pitocin. Perineum inspected and found to have first degree laceration, which was repaired with good hemostasis achieved.

## 2018-08-05 ENCOUNTER — Ambulatory Visit (INDEPENDENT_AMBULATORY_CARE_PROVIDER_SITE_OTHER): Payer: Medicaid Other | Admitting: Obstetrics & Gynecology

## 2018-08-05 ENCOUNTER — Encounter: Payer: Self-pay | Admitting: Obstetrics & Gynecology

## 2018-08-05 ENCOUNTER — Other Ambulatory Visit: Payer: Self-pay

## 2018-08-05 VITALS — BP 118/68 | HR 67 | Wt 171.0 lb

## 2018-08-05 DIAGNOSIS — Z3A36 36 weeks gestation of pregnancy: Secondary | ICD-10-CM

## 2018-08-05 DIAGNOSIS — Z1389 Encounter for screening for other disorder: Secondary | ICD-10-CM

## 2018-08-05 DIAGNOSIS — Z3483 Encounter for supervision of other normal pregnancy, third trimester: Secondary | ICD-10-CM

## 2018-08-05 DIAGNOSIS — Z331 Pregnant state, incidental: Secondary | ICD-10-CM

## 2018-08-05 LAB — POCT URINALYSIS DIPSTICK OB
Blood, UA: NEGATIVE
Glucose, UA: NEGATIVE
Ketones, UA: NEGATIVE
NITRITE UA: NEGATIVE

## 2018-08-05 NOTE — Progress Notes (Signed)
   LOW-RISK PREGNANCY VISIT Patient name: Brenda Carlson MRN [id number]  Date of birth: 04/12/1996 Chief Complaint:   Routine Prenatal Visit  History of Present Illness:   Brenda Carlson is a 23 y.o. G2P1001 female at [redacted]w[redacted]d with an Estimated Date of Delivery: 09/02/18 being seen today for ongoing management of a low-risk pregnancy.  Today she reports no complaints. Contractions: Irregular. Vag. Bleeding: None.  Movement: Present. denies leaking of fluid. Review of Systems:   Pertinent items are noted in HPI Denies abnormal vaginal discharge w/ itching/odor/irritation, headaches, visual changes, shortness of breath, chest pain, abdominal pain, severe nausea/vomiting, or problems with urination or bowel movements unless otherwise stated above. Pertinent History Reviewed:  Reviewed past medical,surgical, social, obstetrical and family history.  Reviewed problem list, medications and allergies. Physical Assessment:   Vitals:   08/05/18 0935  BP: 118/68  Pulse: 67  Weight: 171 lb (77.6 kg)  Body mass index is 27.6 kg/m.        Physical Examination:   General appearance: Well appearing, and in no distress  Mental status: Alert, oriented to person, place, and time  Skin: Warm & dry  Cardiovascular: Normal heart rate noted  Respiratory: Normal respiratory effort, no distress  Abdomen: Soft, gravid, nontender  Pelvic: Cervical exam deferred         Extremities: Edema: None  Fetal Status: Fetal Heart Rate (bpm): 131 Fundal Height: 35 cm Movement: Present    Results for orders placed or performed in visit on 08/05/18 (from the past 24 hour(s))  POC Urinalysis Dipstick OB   Collection Time: 08/05/18  9:35 AM  Result Value Ref Range   Color, UA     Clarity, UA     Glucose, UA Negative Negative   Bilirubin, UA     Ketones, UA neg    Spec Grav, UA     Blood, UA neg    pH, UA     POC,PROTEIN,UA Trace Negative, Trace, Small (1+), Moderate (2+), Large (3+), 4+   Urobilinogen, UA     Nitrite, UA neg    Leukocytes, UA Moderate (2+) (A) Negative   Appearance     Odor      Assessment & Plan:  1) Low-risk pregnancy G2P1001 at [redacted]w[redacted]d with an Estimated Date of Delivery: 09/02/18      Meds: No orders of the defined types were placed in this encounter.  Labs/procedures today:   Plan:  Continue routine obstetrical care   Reviewed: Term labor symptoms and general obstetric precautions including but not limited to vaginal bleeding, contractions, leaking of fluid and fetal movement were reviewed in detail with the patient.  All questions were answered  Follow-up: Return in about 1 week (around 08/12/2018) for LROB, GBS.  Orders Placed This Encounter  Procedures  . POC Urinalysis Dipstick OB   Lazaro Arms 08/05/2018 9:42 AM

## 2018-08-12 ENCOUNTER — Ambulatory Visit (INDEPENDENT_AMBULATORY_CARE_PROVIDER_SITE_OTHER): Payer: Medicaid Other | Admitting: Family Medicine

## 2018-08-12 VITALS — BP 136/71 | HR 70 | Wt 170.4 lb

## 2018-08-12 DIAGNOSIS — Z3483 Encounter for supervision of other normal pregnancy, third trimester: Secondary | ICD-10-CM

## 2018-08-12 DIAGNOSIS — Z3A37 37 weeks gestation of pregnancy: Secondary | ICD-10-CM

## 2018-08-12 DIAGNOSIS — Z331 Pregnant state, incidental: Secondary | ICD-10-CM

## 2018-08-12 DIAGNOSIS — Z1389 Encounter for screening for other disorder: Secondary | ICD-10-CM

## 2018-08-12 LAB — POCT URINALYSIS DIPSTICK OB
Blood, UA: NEGATIVE
Glucose, UA: NEGATIVE
Ketones, UA: NEGATIVE
Leukocytes, UA: NEGATIVE
NITRITE UA: NEGATIVE
PROTEIN: NEGATIVE

## 2018-08-15 LAB — GC/CHLAMYDIA PROBE AMP
Chlamydia trachomatis, NAA: NEGATIVE
Neisseria gonorrhoeae by PCR: NEGATIVE

## 2018-08-15 NOTE — Progress Notes (Signed)
    PRENATAL VISIT NOTE  Subjective:  Brenda Carlson is a 23 y.o. G2P1001 at [redacted]w[redacted]d being seen today for ongoing prenatal care.  She is currently monitored for the following issues for this low-risk pregnancy and has Supervision of normal pregnancy; Rh negative state in antepartum period; Marijuana use; and Abnormal Pap smear of cervix on their problem list.  Patient reports no complaints.  Contractions: Not present. Vag. Bleeding: None.  Movement: Present. Denies leaking of fluid.   The following portions of the patient's history were reviewed and updated as appropriate: allergies, current medications, past family history, past medical history, past social history, past surgical history and problem list. Problem list updated.  Objective:   Vitals:   08/12/18 1042  BP: 136/71  Pulse: 70  Weight: 170 lb 6.4 oz (77.3 kg)    Fetal Status: Fetal Heart Rate (bpm): 130 Fundal Height: 37 cm Movement: Present     General:  Alert, oriented and cooperative. Patient is in no acute distress.  Skin: Skin is warm and dry. No rash noted.   Cardiovascular: Normal heart rate noted  Respiratory: Normal respiratory effort, no problems with respiration noted  Abdomen: Soft, gravid, appropriate for gestational age.  Pain/Pressure: Absent     Pelvic: Cervical exam deferred        Extremities: Normal range of motion.  Edema: None  Mental Status: Normal mood and affect. Normal behavior. Normal judgment and thought content.   Assessment and Plan:  Pregnancy: G2P1001 at [redacted]w[redacted]d  1. Encounter for supervision of other normal pregnancy in third trimester - Prenatal care is up to date, no concerns today - GC/Chlamydia Probe Amp([facility]) - Culture, beta strep (group b only)  2. [redacted] weeks gestation of pregnancy - GC/Chlamydia Probe Amp([facility]) - Culture, beta strep (group b only)  3. Pregnant state, incidental - POC Urinalysis Dipstick OB  4. Screening for genitourinary condition - POC Urinalysis  Dipstick OB   Term labor symptoms and general obstetric precautions including but not limited to vaginal bleeding, contractions, leaking of fluid and fetal movement were reviewed in detail with the patient. Please refer to After Visit Summary for other counseling recommendations.  Return in about 1 week (around 08/19/2018) for Routine prenatal care.  Future Appointments  Date Time Provider Department Center  08/19/2018  2:15 PM Cheral Marker, CNM [facility]    Federico Flake, MD

## 2018-08-16 LAB — CULTURE, BETA STREP (GROUP B ONLY): Strep Gp B Culture: NEGATIVE

## 2018-08-19 ENCOUNTER — Ambulatory Visit (INDEPENDENT_AMBULATORY_CARE_PROVIDER_SITE_OTHER): Payer: Medicaid Other | Admitting: Women's Health

## 2018-08-19 ENCOUNTER — Encounter: Payer: Medicaid Other | Admitting: Women's Health

## 2018-08-19 ENCOUNTER — Encounter: Payer: Self-pay | Admitting: Women's Health

## 2018-08-19 VITALS — BP 112/60 | HR 96 | Wt 177.0 lb

## 2018-08-19 DIAGNOSIS — Z3A38 38 weeks gestation of pregnancy: Secondary | ICD-10-CM | POA: Diagnosis not present

## 2018-08-19 DIAGNOSIS — R030 Elevated blood-pressure reading, without diagnosis of hypertension: Secondary | ICD-10-CM | POA: Diagnosis not present

## 2018-08-19 DIAGNOSIS — Z1389 Encounter for screening for other disorder: Secondary | ICD-10-CM

## 2018-08-19 DIAGNOSIS — Z3483 Encounter for supervision of other normal pregnancy, third trimester: Secondary | ICD-10-CM | POA: Diagnosis not present

## 2018-08-19 DIAGNOSIS — Z331 Pregnant state, incidental: Secondary | ICD-10-CM

## 2018-08-19 LAB — POCT URINALYSIS DIPSTICK OB
GLUCOSE, UA: NEGATIVE
Ketones, UA: NEGATIVE
LEUKOCYTES UA: NEGATIVE
Nitrite, UA: NEGATIVE
RBC UA: NEGATIVE

## 2018-08-19 NOTE — Patient Instructions (Signed)
Lynnea FerrierBobbie Quinter, I greatly value your feedback.  If you receive a survey following your visit with us today, we appreciate you taking the time to fill it out.  Thanks, Joellyn HaffKim Merdis Snodgrass, CNM, WHNP-BC   Call the office [phone]) or go to [facility] if:  You begin to have strong, frequent contractions  Your water breaks.  Sometimes it is a big gush of fluid, sometimes it is just a trickle that keeps getting your panties wet or running down your legs  You have vaginal bleeding.  It is normal to have a small amount of spotting if your cervix was checked.   You don't feel your baby moving like normal.  If you don't, get you something to eat and drink and lay down and focus on feeling your baby move.  You should feel at least 10 movements in 2 hours.  If you don't, you should call the office or go to [facility].    Call the office [phone]) or go to [facility] for these signs of pre-eclampsia:  Severe headache that does not go away with Tylenol  Visual changes- seeing spots, double, blurred vision  Pain under your right breast or upper abdomen that does not go away with Tums or heartburn medicine  Nausea and/or vomiting  Severe swelling in your hands, feet, and face    Preeclampsia and Eclampsia  Preeclampsia is a serious condition that may develop during pregnancy. It is also called toxemia of pregnancy. This condition causes high blood pressure along with other symptoms, such as swelling and headaches. These symptoms may develop as the condition gets worse. Preeclampsia may occur at 20 weeks of pregnancy or later. Diagnosing and treating preeclampsia early is very important. If not treated early, it can cause serious problems for you and your baby. One problem it can lead to is eclampsia. Eclampsia is a condition that causes muscle jerking or shaking (convulsions or seizures) and other serious problems for the mother. During pregnancy, delivering your baby may be the best  treatment for preeclampsia or eclampsia. For most women, preeclampsia and eclampsia symptoms go away after giving birth. In rare cases, a woman may develop preeclampsia after giving birth (postpartum preeclampsia). This usually occurs within 48 hours after childbirth but may occur up to 6 weeks after giving birth. What are the causes? The cause of preeclampsia is not known. What increases the risk? The following risk factors make you more likely to develop preeclampsia:  Being pregnant for the first time.  Having had preeclampsia during a past pregnancy.  Having a family history of preeclampsia.  Having high blood pressure.  Being pregnant with more than one baby.  Being 5835 or older.  Being African-American.  Having kidney disease or diabetes.  Having medical conditions such as lupus or blood diseases.  Being very overweight (obese). What are the signs or symptoms? The earliest signs of preeclampsia are:  High blood pressure.  Increased protein in your urine. Your health care provider will check for this at every visit before you give birth (prenatal visit). Other symptoms that may develop as the condition gets worse include:  Severe headaches.  Sudden weight gain.  Swelling of the hands, face, legs, and feet.  Nausea and vomiting.  Vision problems, such as blurred or double vision.  Numbness in the face, arms, legs, and feet.  Urinating less than usual.  Dizziness.  Slurred speech.  Abdominal pain, especially upper abdominal pain.  Convulsions or seizures. How is this diagnosed? There are  no screening tests for preeclampsia. Your health care provider will ask you about symptoms and check for signs of preeclampsia during your prenatal visits. You may also have tests that include:  Urine tests.  Blood tests.  Checking your blood pressure.  Monitoring your baby's heart rate.  Ultrasound. How is this treated? You and your health care provider will  determine the treatment approach that is best for you. Treatment may include:  Having more frequent prenatal exams to check for signs of preeclampsia, if you have an increased risk for preeclampsia.  Medicine to lower your blood pressure.  Staying in the hospital, if your condition is severe. There, treatment will focus on controlling your blood pressure and the amount of fluids in your body (fluid retention).  Taking medicine (magnesium sulfate) to prevent seizures. This may be given as an injection or through an IV.  Taking a low-dose aspirin during your pregnancy.  Delivering your baby early, if your condition gets worse. You may have your labor started with medicine (induced), or you may have a cesarean delivery. Follow these instructions at home: Eating and drinking   Drink enough fluid to keep your urine pale yellow.  Avoid caffeine. Lifestyle  Do not use any products that contain nicotine or tobacco, such as cigarettes and e-cigarettes. If you need help quitting, ask your health care provider.  Do not use alcohol or drugs.  Avoid stress as much as possible. Rest and get plenty of sleep. General instructions  Take over-the-counter and prescription medicines only as told by your health care provider.  When lying down, lie on your left side. This keeps pressure off your major blood vessels.  When sitting or lying down, raise (elevate) your feet. Try putting some pillows underneath your lower legs.  Exercise regularly. Ask your health care provider what kinds of exercise are best for you.  Keep all follow-up and prenatal visits as told by your health care provider. This is important. How is this prevented? There is no known way of preventing preeclampsia or eclampsia from developing. However, to lower your risk of complications and detect problems early:  Get regular prenatal care. Your health care provider may be able to diagnose and treat the condition early.  Maintain  a healthy weight. Ask your health care provider for help managing weight gain during pregnancy.  Work with your health care provider to manage any long-term (chronic) health conditions you have, such as diabetes or kidney problems.  You may have tests of your blood pressure and kidney function after giving birth.  Your health care provider may have you take low-dose aspirin during your next pregnancy. Contact a health care provider if:  You have symptoms that your health care provider told you may require more treatment or monitoring, such as: ? Headaches. ? Nausea or vomiting. ? Abdominal pain. ? Dizziness. ? Light-headedness. Get help right away if:  You have severe: ? Abdominal pain. ? Headaches that do not get better. ? Dizziness. ? Vision problems. ? Confusion. ? Nausea or vomiting.  You have any of the following: ? A seizure. ? Sudden, rapid weight gain. ? Sudden swelling in your hands, ankles, or face. ? Trouble moving any part of your body. ? Numbness in any part of your body. ? Trouble speaking. ? Abnormal bleeding.  You faint. Summary  Preeclampsia is a serious condition that may develop during pregnancy. It is also called toxemia of pregnancy.  This condition causes high blood pressure along with other symptoms, such  as swelling and headaches.  Diagnosing and treating preeclampsia early is very important. If not treated early, it can cause serious problems for you and your baby.  Get help right away if you have symptoms that your health care provider told you to watch for. This information is not intended to replace advice given to you by your health care provider. Make sure you discuss any questions you have with your health care provider. Document Released: 07/11/2000 Document Revised: 06/30/2017 Document Reviewed: 02/18/2016 Elsevier Interactive Patient Education  2019 [employer].

## 2018-08-19 NOTE — Progress Notes (Signed)
LOW-RISK PREGNANCY VISIT Patient name: Brenda Carlson MRN [id number]  Date of birth: 11-04-1995 Chief Complaint:   Routine Prenatal Visit  History of Present Illness:   Brenda Carlson is a 23 y.o. G2P1001 female at [redacted]w[redacted]d with an Estimated Date of Delivery: 09/02/18 being seen today for ongoing management of a low-risk pregnancy.  Today she reports face/nose has been swelling, got dad's bp cuff, was 150s/80s. When swelling goes down bp is normal. Headaches throughout pregnancy, a little more frequent for the last month. Denies visual changes, ruq/epigastric pain. Some n/v. No h/o HTN, pre-e. This is a new FOB. Pt states his previous partner had eclampsia. Contractions: Irritability. Vag. Bleeding: None.  Movement: Present. denies leaking of fluid. Review of Systems:   Pertinent items are noted in HPI Denies abnormal vaginal discharge w/ itching/odor/irritation, headaches, visual changes, shortness of breath, chest pain, abdominal pain, severe nausea/vomiting, or problems with urination or bowel movements unless otherwise stated above. Pertinent History Reviewed:  Reviewed past medical,surgical, social, obstetrical and family history.  Reviewed problem list, medications and allergies. Physical Assessment:   Vitals:   08/19/18 0858 08/19/18 0904  BP: (!) 143/85 112/60  Pulse: 97 96  Weight: 177 lb (80.3 kg)   Body mass index is 28.57 kg/m.  Manual bp check: 118/62 Lt, 112/60 Rt        Physical Examination:   General appearance: Well appearing, and in no distress  Mental status: Alert, oriented to person, place, and time  Skin: Warm & dry  Cardiovascular: Normal heart rate noted  Respiratory: Normal respiratory effort, no distress  Abdomen: Soft, gravid, nontender  Pelvic: Cervical exam deferred         Extremities: Edema: Trace, DTRs 2+, no clonus  Fetal Status: Fetal Heart Rate (bpm): 135 Fundal Height: 37 cm Movement: Present    Results for orders placed or performed in visit on  08/19/18 (from the past 24 hour(s))  POC Urinalysis Dipstick OB   Collection Time: 08/19/18  9:02 AM  Result Value Ref Range   Color, UA     Clarity, UA     Glucose, UA Negative Negative   Bilirubin, UA     Ketones, UA neg    Spec Grav, UA     Blood, UA neg    pH, UA     POC,PROTEIN,UA Small (1+) Negative, Trace, Small (1+), Moderate (2+), Large (3+), 4+   Urobilinogen, UA     Nitrite, UA neg    Leukocytes, UA Negative Negative   Appearance     Odor      Assessment & Plan:  1) Low-risk pregnancy G2P1001 at [redacted]w[redacted]d with an Estimated Date of Delivery: 09/02/18   2) Initially elevated bp, recheck completely normal, 1+protein-has had this earlier in pregnancy as well. Will get pre-e labs today, bring back in am for bp check. Reviewed pre-e s/s, reasons to seek care   Meds: No orders of the defined types were placed in this encounter.  Labs/procedures today: pre-e labs  Plan:  Continue routine obstetrical care   Reviewed: Term labor symptoms and general obstetric precautions including but not limited to vaginal bleeding, contractions, leaking of fluid and fetal movement were reviewed in detail with the patient.  All questions were answered  Follow-up: Return for in am for bp check w/ nurse.  Orders Placed This Encounter  Procedures  . Comprehensive metabolic panel  . CBC  . Protein / creatinine ratio, urine  . POC Urinalysis Dipstick OB   Cheral Marker  CNM, WHNP-BC 08/19/2018 11:02 AM

## 2018-08-20 ENCOUNTER — Encounter: Payer: Self-pay | Admitting: *Deleted

## 2018-08-20 ENCOUNTER — Ambulatory Visit (INDEPENDENT_AMBULATORY_CARE_PROVIDER_SITE_OTHER): Payer: Medicaid Other | Admitting: *Deleted

## 2018-08-20 VITALS — BP 128/80 | HR 55 | Ht 67.0 in | Wt 176.5 lb

## 2018-08-20 DIAGNOSIS — Z3483 Encounter for supervision of other normal pregnancy, third trimester: Secondary | ICD-10-CM

## 2018-08-20 DIAGNOSIS — Z331 Pregnant state, incidental: Secondary | ICD-10-CM

## 2018-08-20 DIAGNOSIS — Z1389 Encounter for screening for other disorder: Secondary | ICD-10-CM

## 2018-08-20 LAB — COMPREHENSIVE METABOLIC PANEL
ALK PHOS: 164 IU/L — AB (ref 39–117)
ALT: 10 IU/L (ref 0–32)
AST: 11 IU/L (ref 0–40)
Albumin/Globulin Ratio: 1.4 (ref 1.2–2.2)
Albumin: 3.6 g/dL — ABNORMAL LOW (ref 3.9–5.0)
BUN/Creatinine Ratio: 12 (ref 9–23)
BUN: 7 mg/dL (ref 6–20)
Bilirubin Total: 0.3 mg/dL (ref 0.0–1.2)
CO2: 21 mmol/L (ref 20–29)
Calcium: 9.4 mg/dL (ref 8.7–10.2)
Chloride: 103 mmol/L (ref 96–106)
Creatinine, Ser: 0.59 mg/dL (ref 0.57–1.00)
GFR calc Af Amer: 150 mL/min/{1.73_m2} (ref >59)
GFR calc non Af Amer: 131 mL/min/{1.73_m2} (ref >59)
Globulin, Total: 2.5 g/dL (ref 1.5–4.5)
Glucose: 80 mg/dL (ref 65–99)
POTASSIUM: 4.2 mmol/L (ref 3.5–5.2)
Sodium: 140 mmol/L (ref 134–144)
Total Protein: 6.1 g/dL (ref 6.0–8.5)

## 2018-08-20 LAB — POCT URINALYSIS DIPSTICK OB
Glucose, UA: NEGATIVE
Ketones, UA: NEGATIVE
Nitrite, UA: NEGATIVE
POC,PROTEIN,UA: NEGATIVE

## 2018-08-20 LAB — PROTEIN / CREATININE RATIO, URINE
Creatinine, Urine: 198.1 mg/dL
PROTEIN/CREAT RATIO: 119 mg/g{creat} (ref 0–200)
Protein, Ur: 23.6 mg/dL

## 2018-08-20 LAB — CBC
Hematocrit: 36.5 % (ref 34.0–46.6)
Hemoglobin: 12.9 g/dL (ref 11.1–15.9)
MCH: 33.4 pg — ABNORMAL HIGH (ref 26.6–33.0)
MCHC: 35.3 g/dL (ref 31.5–35.7)
MCV: 95 fL (ref 79–97)
Platelets: 153 10*3/uL (ref 150–450)
RBC: 3.86 x10E6/uL (ref 3.77–5.28)
RDW: 12.8 % (ref 11.7–15.4)
WBC: 9.9 10*3/uL (ref 3.4–10.8)

## 2018-08-20 NOTE — Progress Notes (Signed)
Pt here for BP check. BP this am was 128/80. Selena Batten, CNM advised to schedule appt in 1 week for LROB. Pt voiced understanding. JSY

## 2018-08-22 ENCOUNTER — Inpatient Hospital Stay (HOSPITAL_COMMUNITY)
Admission: AD | Admit: 2018-08-22 | Discharge: 2018-08-24 | DRG: 806 | Disposition: A | Discharge status: Home / Self Care | Payer: Medicaid Other | Attending: Obstetrics and Gynecology | Admitting: Obstetrics and Gynecology

## 2018-08-22 ENCOUNTER — Encounter (HOSPITAL_COMMUNITY): Payer: Self-pay

## 2018-08-22 DIAGNOSIS — O99324 Drug use complicating childbirth: Secondary | ICD-10-CM | POA: Diagnosis present

## 2018-08-22 DIAGNOSIS — Z6791 Unspecified blood type, Rh negative: Secondary | ICD-10-CM

## 2018-08-22 DIAGNOSIS — O26893 Other specified pregnancy related conditions, third trimester: Secondary | ICD-10-CM | POA: Diagnosis present

## 2018-08-22 DIAGNOSIS — F1290 Cannabis use, unspecified, uncomplicated: Secondary | ICD-10-CM | POA: Diagnosis present

## 2018-08-22 DIAGNOSIS — Z3A38 38 weeks gestation of pregnancy: Secondary | ICD-10-CM

## 2018-08-22 DIAGNOSIS — Z3483 Encounter for supervision of other normal pregnancy, third trimester: Secondary | ICD-10-CM

## 2018-08-22 NOTE — MAU Note (Signed)
Contraction since 0300 last night. 5-7 mins apart, has brownish-pink discharge. +FM.

## 2018-08-23 ENCOUNTER — Other Ambulatory Visit: Payer: Self-pay

## 2018-08-23 ENCOUNTER — Encounter (HOSPITAL_COMMUNITY): Payer: Self-pay

## 2018-08-23 DIAGNOSIS — Z3A38 38 weeks gestation of pregnancy: Secondary | ICD-10-CM | POA: Diagnosis not present

## 2018-08-23 DIAGNOSIS — Z3483 Encounter for supervision of other normal pregnancy, third trimester: Secondary | ICD-10-CM | POA: Diagnosis present

## 2018-08-23 DIAGNOSIS — F1290 Cannabis use, unspecified, uncomplicated: Secondary | ICD-10-CM | POA: Diagnosis present

## 2018-08-23 DIAGNOSIS — Z6791 Unspecified blood type, Rh negative: Secondary | ICD-10-CM | POA: Diagnosis not present

## 2018-08-23 DIAGNOSIS — O99324 Drug use complicating childbirth: Secondary | ICD-10-CM | POA: Diagnosis present

## 2018-08-23 DIAGNOSIS — O26893 Other specified pregnancy related conditions, third trimester: Secondary | ICD-10-CM | POA: Diagnosis present

## 2018-08-23 LAB — RAPID URINE DRUG SCREEN, HOSP PERFORMED
Amphetamines: NOT DETECTED
BARBITURATES: NOT DETECTED
Benzodiazepines: NOT DETECTED
COCAINE: NOT DETECTED
Opiates: NOT DETECTED
Tetrahydrocannabinol: POSITIVE — AB

## 2018-08-23 LAB — COMPREHENSIVE METABOLIC PANEL
ALK PHOS: 131 U/L — AB (ref 38–126)
ALT: 14 U/L (ref 0–44)
AST: 17 U/L (ref 15–41)
Albumin: 3.1 g/dL — ABNORMAL LOW (ref 3.5–5.0)
Anion gap: 9 (ref 5–15)
BUN: 9 mg/dL (ref 6–20)
CO2: 20 mmol/L — ABNORMAL LOW (ref 22–32)
CREATININE: 0.49 mg/dL (ref 0.44–1.00)
Calcium: 8.9 mg/dL (ref 8.9–10.3)
Chloride: 104 mmol/L (ref 98–111)
GFR calc Af Amer: >60 mL/min (ref >60)
GFR calc non Af Amer: >60 mL/min (ref >60)
Glucose, Bld: 94 mg/dL (ref 70–99)
Potassium: 3.8 mmol/L (ref 3.5–5.1)
Sodium: 133 mmol/L — ABNORMAL LOW (ref 135–145)
Total Bilirubin: 0.4 mg/dL (ref 0.3–1.2)
Total Protein: 6 g/dL — ABNORMAL LOW (ref 6.5–8.1)

## 2018-08-23 LAB — CBC
HCT: 34.3 % — ABNORMAL LOW (ref 36.0–46.0)
HEMOGLOBIN: 11.8 g/dL — AB (ref 12.0–15.0)
MCH: 32.5 pg (ref 26.0–34.0)
MCHC: 34.4 g/dL (ref 30.0–36.0)
MCV: 94.5 fL (ref 80.0–100.0)
Platelets: 146 10*3/uL — ABNORMAL LOW (ref 150–400)
RBC: 3.63 MIL/uL — AB (ref 3.87–5.11)
RDW: 13.7 % (ref 11.5–15.5)
WBC: 16.6 10*3/uL — ABNORMAL HIGH (ref 4.0–10.5)

## 2018-08-23 LAB — RPR: RPR Ser Ql: NONREACTIVE

## 2018-08-23 MED ORDER — LIDOCAINE HCL (PF) 1 % IJ SOLN
30.0000 mL | INTRAMUSCULAR | Status: DC | PRN
  Administered 2018-08-23: 30 mL via SUBCUTANEOUS
  Filled 2018-08-23: qty 30

## 2018-08-23 MED ORDER — BENZOCAINE-MENTHOL 20-0.5 % EX AERO
1.0000 "application " | INHALATION_SPRAY | CUTANEOUS | Status: DC | PRN
  Administered 2018-08-23: 1 via TOPICAL
  Filled 2018-08-23 (×2): qty 56

## 2018-08-23 MED ORDER — ACETAMINOPHEN 325 MG PO TABS
650.0000 mg | ORAL_TABLET | ORAL | Status: DC | PRN
  Administered 2018-08-23: 650 mg via ORAL
  Filled 2018-08-23: qty 2

## 2018-08-23 MED ORDER — IBUPROFEN 600 MG PO TABS
600.0000 mg | ORAL_TABLET | Freq: Four times a day (QID) | ORAL | Status: DC
  Administered 2018-08-23 – 2018-08-24 (×7): 600 mg via ORAL
  Filled 2018-08-23 (×7): qty 1

## 2018-08-23 MED ORDER — ONDANSETRON HCL 4 MG PO TABS: 4.0000 mg | ORAL_TABLET | ORAL | Status: DC | PRN

## 2018-08-23 MED ORDER — DIPHENHYDRAMINE HCL 25 MG PO CAPS: 25.0000 mg | ORAL_CAPSULE | Freq: Four times a day (QID) | ORAL | Status: DC | PRN

## 2018-08-23 MED ORDER — DIBUCAINE 1 % RE OINT: 1.0000 "application " | TOPICAL_OINTMENT | RECTAL | Status: DC | PRN

## 2018-08-23 MED ORDER — OXYTOCIN 40 UNITS IN NORMAL SALINE INFUSION - SIMPLE MED
2.5000 [IU]/h | INTRAVENOUS | Status: DC
  Filled 2018-08-23: qty 1000

## 2018-08-23 MED ORDER — LACTATED RINGERS IV SOLN: 500.0000 mL | INTRAVENOUS | Status: DC | PRN

## 2018-08-23 MED ORDER — OXYTOCIN BOLUS FROM INFUSION
500.0000 mL | Freq: Once | INTRAVENOUS | Status: AC
  Administered 2018-08-23: 500 mL via INTRAVENOUS

## 2018-08-23 MED ORDER — COCONUT OIL OIL
1.0000 "application " | TOPICAL_OIL | Status: DC | PRN
  Filled 2018-08-23: qty 120

## 2018-08-23 MED ORDER — RHO D IMMUNE GLOBULIN 1500 UNIT/2ML IJ SOSY
300.0000 ug | PREFILLED_SYRINGE | Freq: Once | INTRAMUSCULAR | Status: AC
  Administered 2018-08-23: 300 ug via INTRAMUSCULAR
  Filled 2018-08-23: qty 2

## 2018-08-23 MED ORDER — SOD CITRATE-CITRIC ACID 500-334 MG/5ML PO SOLN: 30.0000 mL | ORAL | Status: DC | PRN

## 2018-08-23 MED ORDER — FENTANYL CITRATE (PF) 100 MCG/2ML IJ SOLN
100.0000 ug | Freq: Once | INTRAMUSCULAR | Status: AC
  Administered 2018-08-23: 100 ug via INTRAVENOUS
  Filled 2018-08-23: qty 2

## 2018-08-23 MED ORDER — ONDANSETRON HCL 4 MG/2ML IJ SOLN: 4.0000 mg | INTRAMUSCULAR | Status: DC | PRN

## 2018-08-23 MED ORDER — ZOLPIDEM TARTRATE 5 MG PO TABS: 5.0000 mg | ORAL_TABLET | Freq: Every evening | ORAL | Status: DC | PRN

## 2018-08-23 MED ORDER — PRENATAL MULTIVITAMIN CH
1.0000 | ORAL_TABLET | Freq: Every day | ORAL | Status: DC
  Administered 2018-08-23 – 2018-08-24 (×2): 1 via ORAL
  Filled 2018-08-23 (×2): qty 1

## 2018-08-23 MED ORDER — TETANUS-DIPHTH-ACELL PERTUSSIS 5-2.5-18.5 LF-MCG/0.5 IM SUSP: 0.5000 mL | Freq: Once | INTRAMUSCULAR | Status: DC

## 2018-08-23 MED ORDER — ACETAMINOPHEN 325 MG PO TABS: 650.0000 mg | ORAL_TABLET | ORAL | Status: DC | PRN

## 2018-08-23 MED ORDER — SENNOSIDES-DOCUSATE SODIUM 8.6-50 MG PO TABS
2.0000 | ORAL_TABLET | ORAL | Status: DC
  Administered 2018-08-23: 2 via ORAL
  Filled 2018-08-23: qty 2

## 2018-08-23 MED ORDER — SIMETHICONE 80 MG PO CHEW: 80.0000 mg | CHEWABLE_TABLET | ORAL | Status: DC | PRN

## 2018-08-23 MED ORDER — WITCH HAZEL-GLYCERIN EX PADS
1.0000 "application " | MEDICATED_PAD | CUTANEOUS | Status: DC | PRN
  Administered 2018-08-24: 1 via TOPICAL

## 2018-08-23 MED ORDER — LACTATED RINGERS IV SOLN
INTRAVENOUS | Status: DC
  Administered 2018-08-23: 01:00:00 via INTRAVENOUS

## 2018-08-23 MED ORDER — ONDANSETRON HCL 4 MG/2ML IJ SOLN: 4.0000 mg | Freq: Four times a day (QID) | INTRAMUSCULAR | Status: DC | PRN

## 2018-08-23 NOTE — Lactation Note (Signed)
This note was copied from a baby's chart. Lactation Consultation Note  Patient Name: Brenda Carlson [id number] Date: 08/23/2018 Reason for consult: Early term 37-38.6wks;Follow-up assessment  P2 mother whose infant is now 22 hours old.  Mother was interested in trying to latch with my help.  Baby was awake and alert but not showing feeding cues.    Assisted to latch in the football hold on the right breast without difficulty.  Infant had wide gape and flanged lips.  Mother denied pain with latching.  Instructed her to do intermittent breast compressions during feedings.  Mother will also continue to do hand expression before/after feedings to help increase milk supply.  Mother demonstrated hand expression and was able to easily express colostrum drops which I finger fed back to baby.    Mother will call for latch assistance as needed.  RN in room and updated.   Maternal Data Formula Feeding for Exclusion: No Has patient been taught Hand Expression?: Yes  Feeding Feeding Type: Breast Fed  LATCH Score Latch: Grasps breast easily, tongue down, lips flanged, rhythmical sucking.  Audible Swallowing: A few with stimulation  Type of Nipple: Everted at rest and after stimulation(short shafted bilaterally)  Comfort (Breast/Nipple): Soft / non-tender  Hold (Positioning): Assistance needed to correctly position infant at breast and maintain latch.  LATCH Score: 8  Interventions Interventions: Breast feeding basics reviewed;Assisted with latch;Skin to skin;Breast massage;Hand express;Breast compression;Position options;Support pillows;Adjust position  Lactation Tools Discussed/Used WIC Program: Yes   Consult Status Consult Status: Follow-up Date: 08/24/18 Follow-up type: In-patient    Brenda Carlson [name] 08/23/2018, 3:10 PM

## 2018-08-23 NOTE — Lactation Note (Signed)
This note was copied from a baby's chart. Lactation Consultation Note  Patient Name: Brenda Lynnea FerrierBobbie Tennison ZOXWR'UToday's Date: 08/23/2018 Reason for consult: Initial assessment;1st time breastfeeding;Early term 37-38.6wks P1, 3 hour female infant. Per mom, infant latched at L&D but nipples are little sore infant breastfeed for 15 minutes.. Infant not interested in latching at this time. [facility] [place]  LC discussed hand expression and mom easily expressed 10 ml of colostrum and infant took 5 ml by spoon. Mom is knowledgeable to breastfeed infant according to hunger cues and not exceed 3 hours without breastfeeding infant. LC discussed I & O. Reviewed Baby & Me book's Breastfeeding Basics.  Mom made aware of O/P services, breastfeeding support groups, community resources, and our phone # for post-discharge questions.  Maternal Data Formula Feeding for Exclusion: No Has patient been taught Hand Expression?: Yes(Mom easily expressed 10 ml of colostrum.) Does the patient have breastfeeding experience prior to this delivery?: No  Feeding Feeding Type: Breast Fed  LATCH Score Latch: Repeated attempts needed to sustain latch, nipple held in mouth throughout feeding, stimulation needed to elicit sucking reflex.  Audible Swallowing: Spontaneous and intermittent  Type of Nipple: Everted at rest and after stimulation  Comfort (Breast/Nipple): Soft / non-tender  Hold (Positioning): No assistance needed to correctly position infant at breast.  LATCH Score: 9  Interventions Interventions: Breast feeding basics reviewed;Breast massage;Expressed milk;Position options;Skin to skin;Hand express  Lactation Tools Discussed/Used WIC Program: Yes   Consult Status Consult Status: Follow-up Date: 08/24/18 Follow-up type: In-patient    Danelle EarthlyRobin Adeja Sarratt 08/23/2018, 5:37 AM
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
## 2018-08-24 LAB — RH IG WORKUP (INCLUDES ABO/RH)
ABO/RH(D): B NEG
Fetal Screen: NEGATIVE
Gestational Age(Wks): 38.4
UNIT DIVISION: 0

## 2018-08-24 MED ORDER — IBUPROFEN 600 MG PO TABS: 600.0000 mg | ORAL_TABLET | Freq: Four times a day (QID) | ORAL | 0 refills | Status: DC

## 2018-08-24 NOTE — Progress Notes (Signed)
CSW acknowledges consult and completed clinical assessment. Clinical documentation will follow.  CSW made a CPS report to [facility] DSS (CPS intake worker Victorino Dike). There are barriers to infant's discharge at this time. CSW will follow up with [facility] DSS CPS in the morning.   Celso Sickle, LCSWA Clinical Social Worker [facility] Cell#: [phone]

## 2018-08-24 NOTE — Discharge Summary (Signed)
OB Discharge Summary     Patient Name: Brenda Carlson DOB: July 01, 1996 MRN: [id number]  Date of admission: 08/22/2018 Delivering MD: Rolm Bookbinder   Date of discharge: 08/24/2018  Admitting diagnosis: 38wks Ctx Intrauterine pregnancy: [redacted]w[redacted]d     Secondary diagnosis:  Active Problems:   Vaginal delivery  Additional problems: none     Discharge diagnosis: Term Pregnancy Delivered                                                                                                Post partum procedures:none  Augmentation: AROM  Complications: None  Hospital course:  Onset of Labor With Vaginal Delivery     23 y.o. yo [id number] at [redacted]w[redacted]d was admitted in Active Labor on 08/22/2018. Patient had an uncomplicated labor course as follows:  Membrane Rupture Time/Date: 1:39 AM ,08/23/2018   Intrapartum Procedures: Episiotomy: None [1]                                         Lacerations:  1st degree [2]  Patient had a delivery of a Viable infant. 08/23/2018  Information for the patient's newborn:  Nancie, [place] [[id number]]  Delivery Method: Vaginal, Spontaneous(Filed from Delivery Summary)    Pateint had an uncomplicated postpartum course.  She is ambulating, tolerating a regular diet, passing flatus, and urinating well. Patient is discharged home in stable condition on 08/24/18.   Physical exam  Vitals:   08/23/18 1320 08/23/18 1724 08/23/18 2215 08/24/18 0517  BP: 127/74 132/82 111/83 122/70  Pulse: 65 62 64 61  Resp: 18 19 17 18   Temp: 98.7 F (37.1 C) 98.4 F (36.9 C) 98 F (36.7 C) 98.2 F (36.8 C)  TempSrc: Oral Oral Oral Oral  SpO2: 100%  98% 98%  Weight:      Height:       General: alert, cooperative and no distress Lochia: appropriate Uterine Fundus: firm Incision: N/A DVT Evaluation: No evidence of DVT seen on physical exam. Negative Homan's sign. No cords or calf tenderness. No significant calf/ankle edema. Labs: Lab Results  Component Value Date   WBC  16.6 (H) 08/23/2018   HGB 11.8 (L) 08/23/2018   HCT 34.3 (L) 08/23/2018   MCV 94.5 08/23/2018   PLT 146 (L) 08/23/2018   CMP Latest Ref Rng & Units 08/23/2018  Glucose 70 - 99 mg/dL 94  BUN 6 - 20 mg/dL 9  Creatinine 9.37 - 3.42 mg/dL 8.76  Sodium 811 - 572 mmol/L 133(L)  Potassium 3.5 - 5.1 mmol/L 3.8  Chloride 98 - 111 mmol/L 104  CO2 22 - 32 mmol/L 20(L)  Calcium 8.9 - 10.3 mg/dL 8.9  Total Protein 6.5 - 8.1 g/dL 6.0(L)  Total Bilirubin 0.3 - 1.2 mg/dL 0.4  Alkaline Phos 38 - 126 U/L 131(H)  AST 15 - 41 U/L 17  ALT 0 - 44 U/L 14    Discharge instruction: per After Visit Summary and "Baby and Me Booklet".  After visit meds:  Allergies as of  08/24/2018   No Known Allergies     Medication List    TAKE these medications   ibuprofen 600 MG tablet Commonly known as:  ADVIL,MOTRIN Take 1 tablet (600 mg total) by mouth every 6 (six) hours.       Diet: routine diet  Activity: Advance as tolerated. Pelvic rest for 6 weeks.   Outpatient follow up:4 weeks Follow up Appt: Future Appointments  Date Time Provider Department Center  09/27/2018 10:00 AM Cheral MarkerBooker, Kimberly R, CNM [facility]   Follow up Visit:No follow-ups on file.  Postpartum contraception: IUD Mirena  Newborn Data: Live born female  Birth Weight: 6 lb 4 oz (2835 g) APGAR: 8, 8  Newborn Delivery   Birth date/time:  08/23/2018 01:54:00 Delivery type:  Vaginal, Spontaneous     Baby Feeding: Breast Disposition:home with mother   08/24/2018 Levie HeritageJacob J Stinson, DO

## 2018-08-24 NOTE — Discharge Instructions (Signed)
Vaginal Delivery, Care After °Refer to this sheet in the next few weeks. These instructions provide you with information about caring for yourself after vaginal delivery. Your health care provider may also give you more specific instructions. Your treatment has been planned according to current medical practices, but problems sometimes occur. Call your health care provider if you have any problems or questions. °What can I expect after the procedure? °After vaginal delivery, it is common to have: °· Some bleeding from your vagina. °· Soreness in your abdomen, your vagina, and the area of skin between your vaginal opening and your anus (perineum). °· Pelvic cramps. °· Fatigue. °Follow these instructions at home: °Medicines °· Take over-the-counter and prescription medicines only as told by your health care provider. °· If you were prescribed an antibiotic medicine, take it as told by your health care provider. Do not stop taking the antibiotic until it is finished. °Driving ° °· Do not drive or operate heavy machinery while taking prescription pain medicine. °· Do not drive for 24 hours if you received a sedative. °Lifestyle °· Do not drink alcohol. This is especially important if you are breastfeeding or taking medicine to relieve pain. °· Do not use tobacco products, including cigarettes, chewing tobacco, or e-cigarettes. If you need help quitting, ask your health care provider. °Eating and drinking °· Drink at least 8 eight-ounce glasses of water every day unless you are told not to by your health care provider. If you choose to breastfeed your baby, you may need to drink more water than this. °· Eat high-fiber foods every day. These foods may help prevent or relieve constipation. High-fiber foods include: °? Whole grain cereals and breads. °? Brown rice. °? Beans. °? Fresh fruits and vegetables. °Activity °· Return to your normal activities as told by your health care provider. Ask your health care provider what  activities are safe for you. °· Rest as much as possible. Try to rest or take a nap when your baby is sleeping. °· Do not lift anything that is heavier than your baby or 10 lb (4.5 kg) until your health care provider says that it is safe. °· Talk with your health care provider about when you can engage in sexual activity. This may depend on your: °? Risk of infection. °? Rate of healing. °? Comfort and desire to engage in sexual activity. °Vaginal Care °· If you have an episiotomy or a vaginal tear, check the area every day for signs of infection. Check for: °? More redness, swelling, or pain. °? More fluid or blood. °? Warmth. °? Pus or a bad smell. °· Do not use tampons or douches until your health care provider says this is safe. °· Watch for any blood clots that may pass from your vagina. These may look like clumps of dark red, brown, or black discharge. °General instructions °· Keep your perineum clean and dry as told by your health care provider. °· Wear loose, comfortable clothing. °· Wipe from front to back when you use the toilet. °· Ask your health care provider if you can shower or take a bath. If you had an episiotomy or a perineal tear during labor and delivery, your health care provider may tell you not to take baths for a certain length of time. °· Wear a bra that supports your breasts and fits you well. °· If possible, have someone help you with household activities and help care for your baby for at least a few days after you   leave the hospital. °· Keep all follow-up visits for you and your baby as told by your health care provider. This is important. °Contact a health care provider if: °· You have: °? Vaginal discharge that has a bad smell. °? Difficulty urinating. °? Pain when urinating. °? A sudden increase or decrease in the frequency of your bowel movements. °? More redness, swelling, or pain around your episiotomy or vaginal tear. °? More fluid or blood coming from your episiotomy or vaginal  tear. °? Pus or a bad smell coming from your episiotomy or vaginal tear. °? A fever. °? A rash. °? Little or no interest in activities you used to enjoy. °? Questions about caring for yourself or your baby. °· Your episiotomy or vaginal tear feels warm to the touch. °· Your episiotomy or vaginal tear is separating or does not appear to be healing. °· Your breasts are painful, hard, or turn red. °· You feel unusually sad or worried. °· You feel nauseous or you vomit. °· You pass large blood clots from your vagina. If you pass a blood clot from your vagina, save it to show to your health care provider. Do not flush blood clots down the toilet without having your health care provider look at them. °· You urinate more than usual. °· You are dizzy or light-headed. °· You have not breastfed at all and you have not had a menstrual period for 12 weeks after delivery. °· You have stopped breastfeeding and you have not had a menstrual period for 12 weeks after you stopped breastfeeding. °Get help right away if: °· You have: °? Pain that does not go away or does not get better with medicine. °? Chest pain. °? Difficulty breathing. °? Blurred vision or spots in your vision. °? Thoughts about hurting yourself or your baby. °· You develop pain in your abdomen or in one of your legs. °· You develop a severe headache. °· You faint. °· You bleed from your vagina so much that you fill two sanitary pads in one hour. °This information is not intended to replace advice given to you by your health care provider. Make sure you discuss any questions you have with your health care provider. °Document Released: 07/11/2000 Document Revised: 12/26/2015 Document Reviewed: 07/29/2015 °Elsevier Interactive Patient Education © 2019 [employer] ° °

## 2018-08-24 NOTE — Lactation Note (Signed)
This note was copied from a baby's chart. Lactation Consultation Note  Patient Name: Brenda Carlson [id number] Date: 08/24/2018 Reason for consult: Follow-up assessment;Primapara;1st time breastfeeding;Early term 37-38.6wks  P2 mother whose infant is now 80 hours old.  Mother did not breast feed her first child.  Baby was finishing his photography session when I entered.  He was awake and showing feeding cues.  Offered to assist with latching and mother accepted.  Assisted to latch in the football hold on the left breast without difficulty.  Mother was a little bit hesitant to latch baby deeply and to keep him stimulated for sucking.  Reminded her to watch for a wide gape prior to latching and to pull baby deep into breast tissue.  Demonstrated breast compressions and mother will continue these during feedings.  Showed her how she can help keep him awake during feedings.  Baby responded well to gentle stimulation during feedings.  Encouraged to continue feeding 8-12 times/24 hours or sooner if he shows feeding cues.  Mother will call for latch assistance as needed.    Mother will return to work in 6 weeks and does not have a DEBP for home use.  She has WIC in [facility] and will call them today about obtaining a DEBP.  I informed her of our [facility] loaner program here and she may consider this option if she cannot obtain a DEBP from [facility] department.  Mother will keep me informed if she decides to obtain a loaner pump.  RN updated.   Maternal Data Formula Feeding for Exclusion: No Has patient been taught Hand Expression?: Yes Does the patient have breastfeeding experience prior to this delivery?: No  Feeding Feeding Type: Breast Fed  LATCH Score Latch: Grasps breast easily, tongue down, lips flanged, rhythmical sucking.  Audible Swallowing: A few with stimulation  Type of Nipple: Everted at rest and after stimulation  Comfort (Breast/Nipple): Soft / non-tender  Hold  (Positioning): Assistance needed to correctly position infant at breast and maintain latch.  LATCH Score: 8  Interventions Interventions: Breast feeding basics reviewed;Assisted with latch;Skin to skin;Breast massage;Hand express;Breast compression;Coconut oil;Position options;Support pillows;Adjust position  Lactation Tools Discussed/Used WIC Program: Yes   Consult Status Consult Status: Follow-up Date: 08/25/18 Follow-up type: In-patient    Dora Sims 08/24/2018, 12:42 PM

## 2018-08-25 ENCOUNTER — Ambulatory Visit: Payer: Self-pay

## 2018-08-25 NOTE — Lactation Note (Signed)
This note was copied from a baby's chart. Lactation Consultation Note:  Mother reports that breastfeeding is going well.  She reports that she has a crack on the rt nipple and is unable to latch infant.   Observed a tiny crack. Assist mother with hand expression and advised to use ebm on nipple.  Mother was given comfort gels.  Advised mother to use football hold on rt breast.  Discussed supply and demand and need to drain both breast.   Mother has a hand pump and advised to post pump the rt breast.  Rt breast is full and getting firm. Discussed S/S of Mastitis.  Advised mother to cue base feed and allow for cluster feeding.  Mother to breastfeed infant 8-12 times in 24 hours or more.  Discussed treatment and prevention of engorgement . Mother informed of BFSG'S, phone line for breastfeeding questions or concerns.   Patient Name: Boy Fe Essenburg [id number] Date: 08/25/2018 Reason for consult: Follow-up assessment   Maternal Data    Feeding Feeding Type: Breast Fed  LATCH Score Latch: Grasps breast easily, tongue down, lips flanged, rhythmical sucking.  Audible Swallowing: A few with stimulation  Type of Nipple: Everted at rest and after stimulation  Comfort (Breast/Nipple): Soft / non-tender  Hold (Positioning): Assistance needed to correctly position infant at breast and maintain latch.(demonstrated & encouraged deeper latch)  LATCH Score: 8  Interventions Interventions: Hand express;Pre-pump if needed;Position options;Expressed milk;Comfort gels;Hand pump  Lactation Tools Discussed/Used     Consult Status Consult Status: Complete    Michel Bickers 08/25/2018, 12:40 PM

## 2018-08-25 NOTE — Progress Notes (Signed)
CSW contacted [facility] DSS CPS to inquire about infant's discharge plan. CPS intake worker reported that case was assigned to CPS worker J. Colon Branch and that she was currently in a meeting. CPS intake worker agreed to notify CPS worker that a discharge plan is needed for infant.  Celso Sickle, LCSWA Clinical Social Worker [facility] Cell#: [phone]
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
## 2018-08-25 NOTE — Progress Notes (Signed)
CSW contacted [place] DSS CPS worker  (J. Strand) to inquire about infant's discharge plan. CPS worker reported that there are no barriers to infant discharging home with MOB. CPS to follow up with family in the community.  CSW updated patient's bedside RN. Infant to discharge home with MOB.  Laurie Lovejoy, LCSWA Clinical Social Worker [facility] Cell#: [phone]  

## 2018-08-27 ENCOUNTER — Encounter: Payer: Medicaid Other | Admitting: Obstetrics and Gynecology

## 2018-08-27 LAB — BPAM RBC
BLOOD PRODUCT EXPIRATION DATE: 202002072359
Blood Product Expiration Date: 202002092359
ISSUE DATE / TIME: 202001271837
Unit Type and Rh: 9500
Unit Type and Rh: 9500

## 2018-08-27 LAB — TYPE AND SCREEN
ABO/RH(D): B NEG
Antibody Screen: POSITIVE
UNIT DIVISION: 0
Unit division: 0

## 2018-09-02 ENCOUNTER — Encounter: Payer: Self-pay | Admitting: *Deleted

## 2018-09-27 ENCOUNTER — Ambulatory Visit: Payer: Self-pay | Admitting: Women's Health

## 2018-09-27 ENCOUNTER — Encounter: Payer: Self-pay | Admitting: Women's Health

## 2019-06-18 ENCOUNTER — Emergency Department (HOSPITAL_COMMUNITY)
Admission: EM | Admit: 2019-06-18 | Discharge: 2019-06-18 | Disposition: A | Discharge status: Home / Self Care | Payer: Medicaid Other | Attending: Emergency Medicine | Admitting: Emergency Medicine

## 2019-06-18 ENCOUNTER — Encounter (HOSPITAL_COMMUNITY): Payer: Self-pay | Admitting: Emergency Medicine

## 2019-06-18 ENCOUNTER — Other Ambulatory Visit: Payer: Self-pay

## 2019-06-18 DIAGNOSIS — R220 Localized swelling, mass and lump, head: Secondary | ICD-10-CM | POA: Insufficient documentation

## 2019-06-18 DIAGNOSIS — R110 Nausea: Secondary | ICD-10-CM | POA: Diagnosis present

## 2019-06-18 MED ORDER — AMOXICILLIN-POT CLAVULANATE 875-125 MG PO TABS: 1.0000 | ORAL_TABLET | Freq: Two times a day (BID) | ORAL | 0 refills | Status: DC

## 2019-06-18 NOTE — ED Triage Notes (Signed)
Pt reports having wisdom teeth removed on Tuesday and the left side has become swollen and bruised. Is concerned for possible infection.

## 2019-06-18 NOTE — Discharge Instructions (Addendum)
Get help right away if: You have bleeding that is severe or does not stop after you bite down on several gauze pads. You have severe pain that does not get better with medicine. You have swelling that gets worse instead of better. You have a lot of clear fluid or pus coming from the surgical area. You have difficulty swallowing. You cannot open your mouth. You have shortness of breath. You have chest pain.

## 2019-06-18 NOTE — ED Provider Notes (Signed)
[facility] EMERGENCY DEPARTMENT Provider Note   CSN: [id number] Arrival date & time: 06/18/19  1152     History   Chief Complaint Chief Complaint  Patient presents with  . Dental Problem    HPI Brenda Carlson is a 23 y.o. female who presents for left jaw swelling.  The patient had her wisdom teeth removed 4 days ago.  She initially had some swelling and bruising that had been improving and was feeling well up until last night when she states that she was feeling woozy and dizzy and nauseated.  She was driving home and she had to pull over and vomit twice.  She denies any abdominal pain.  She noticed that the left side of her face was beginning to swell and when she awoke this morning she noticed that had doubled in size.  She denies difficulty swallowing, change in phonation, severe pain, trismus, vomiting or fevers.  She took her regular pain medication as prescribed by her oral surgeon this morning and is feeling improved.  She has been icing the area which improved the swelling.     HPI  Past Medical History:  Diagnosis Date  . Medical history non-contributory   . Pregnancy     Patient Active Problem List   Diagnosis Date Noted  . Vaginal delivery 08/23/2018  . Abnormal Pap smear of cervix 03/03/2018  . Marijuana use 03/02/2018  . Supervision of normal pregnancy 03/01/2018  . Rh negative state in antepartum period 03/01/2018    Past Surgical History:  Procedure Laterality Date  . NO PAST SURGERIES       OB History    Gravida  2   Para  2   Term  2   Preterm      AB      Living  2     SAB      TAB      Ectopic      Multiple  0   Live Births  2            Home Medications    Prior to Admission medications   Medication Sig Start Date End Date Taking? Authorizing Provider  ibuprofen (ADVIL,MOTRIN) 600 MG tablet Take 1 tablet (600 mg total) by mouth every 6 (six) hours. 08/24/18   Truett Mainland, DO    Family History Family History   Problem Relation Age of Onset  . Varicose Veins Mother   . Diabetes Mother   . Cystic fibrosis Sister   . Diabetes Paternal Grandmother   . Diabetes Paternal Grandfather     Social History Social History   Tobacco Use  . Smoking status: Never Smoker  . Smokeless tobacco: Never Used  Substance Use Topics  . Alcohol use: Never    Frequency: Never  . Drug use: Never     Allergies   Patient has no known allergies.   Review of Systems Review of Systems  Ten systems reviewed and are negative for acute change, except as noted in the HPI.   Physical Exam Updated Vital Signs BP 140/76 (BP Location: Right Arm)   Pulse 98   Temp 98.6 F (37 C) (Oral)   Resp 18   Ht 5\' 7"  (1.702 m)   Wt 81.6 kg   LMP 06/16/2019   SpO2 99%   BMI 28.19 kg/m   Physical Exam Vitals signs and nursing note reviewed.  Constitutional:      General: She is not in acute distress.  Appearance: She is well-developed. She is not diaphoretic.  HENT:     Head: Normocephalic and atraumatic.      Mouth/Throat:     Lips: Pink.     Mouth: Mucous membranes are moist.     Dentition: No dental caries or dental abscesses.      Comments: No trismus, no obvious erythema or swelling of the gingiva.  Uvula is midline. Eyes:     General: No scleral icterus.    Conjunctiva/sclera: Conjunctivae normal.  Neck:     Musculoskeletal: Normal range of motion.  Cardiovascular:     Rate and Rhythm: Normal rate and regular rhythm.     Heart sounds: Normal heart sounds. No murmur. No friction rub. No gallop.   Pulmonary:     Effort: Pulmonary effort is normal. No respiratory distress.     Breath sounds: Normal breath sounds.  Abdominal:     General: Bowel sounds are normal. There is no distension.     Palpations: Abdomen is soft. There is no mass.     Tenderness: There is no abdominal tenderness. There is no guarding.  Skin:    General: Skin is warm and dry.  Neurological:     Mental Status: She is alert  and oriented to person, place, and time.  Psychiatric:        Behavior: Behavior normal.      ED Treatments / Results  Labs (all labs ordered are listed, but only abnormal results are displayed) Labs Reviewed - No data to display  EKG None  Radiology No results found.  Procedures Procedures (including critical care time)  Medications Ordered in ED Medications - No data to display   Initial Impression / Assessment and Plan / ED Course  I have reviewed the triage vital signs and the nursing notes.  Pertinent labs & imaging results that were available during my care of the patient were reviewed by me and considered in my medical decision making (see chart for details).        23 year old female presents with left facial swelling.  She was concerned for potential infection secondary to recent dental extraction.  She has no obvious signs of infection, no trismus, no abnormal phonation, no tonsillar swelling, her uvula is midline.  No signs of Ludwig's angina.  In shared decision-making the patient would like to try outpatient antibiotics prior to CT scan..  I had a long discussion with her about reasons to seek immediate medical care and how to monitor herself for worsening symptoms at home.  Patient is to follow-up with her oral surgeon.  Discussed return precautions. Final Clinical Impressions(s) / ED Diagnoses   Final diagnoses:  None    ED Discharge Orders    None       Arthor Captain, PA-C 06/18/19 1221    Linwood Dibbles, MD 06/18/19 1526

## 2019-07-18 ENCOUNTER — Other Ambulatory Visit: Payer: Self-pay

## 2019-07-18 ENCOUNTER — Ambulatory Visit: Payer: Medicaid Other | Attending: Internal Medicine

## 2019-07-18 DIAGNOSIS — Z20822 Contact with and (suspected) exposure to covid-19: Secondary | ICD-10-CM

## 2019-07-18 DIAGNOSIS — Z20828 Contact with and (suspected) exposure to other viral communicable diseases: Secondary | ICD-10-CM | POA: Diagnosis not present

## 2019-07-19 LAB — NOVEL CORONAVIRUS, NAA: SARS-CoV-2, NAA: DETECTED — AB

## 2019-08-02 ENCOUNTER — Ambulatory Visit: Payer: Medicaid Other | Attending: Internal Medicine

## 2019-08-02 ENCOUNTER — Other Ambulatory Visit: Payer: Self-pay

## 2019-08-02 DIAGNOSIS — Z20822 Contact with and (suspected) exposure to covid-19: Secondary | ICD-10-CM

## 2019-08-03 LAB — NOVEL CORONAVIRUS, NAA: SARS-CoV-2, NAA: NOT DETECTED

## 2019-08-07 ENCOUNTER — Emergency Department (HOSPITAL_COMMUNITY): Payer: BC Managed Care – PPO

## 2019-08-07 ENCOUNTER — Encounter (HOSPITAL_COMMUNITY): Payer: Self-pay | Admitting: Emergency Medicine

## 2019-08-07 ENCOUNTER — Other Ambulatory Visit: Payer: Self-pay

## 2019-08-07 ENCOUNTER — Emergency Department (HOSPITAL_COMMUNITY)
Admission: EM | Admit: 2019-08-07 | Discharge: 2019-08-07 | Disposition: A | Discharge status: Home / Self Care | Payer: BC Managed Care – PPO | Attending: Emergency Medicine | Admitting: Emergency Medicine

## 2019-08-07 DIAGNOSIS — R0602 Shortness of breath: Secondary | ICD-10-CM | POA: Diagnosis not present

## 2019-08-07 DIAGNOSIS — I309 Acute pericarditis, unspecified: Secondary | ICD-10-CM

## 2019-08-07 DIAGNOSIS — R079 Chest pain, unspecified: Secondary | ICD-10-CM | POA: Diagnosis not present

## 2019-08-07 DIAGNOSIS — R0789 Other chest pain: Secondary | ICD-10-CM | POA: Diagnosis present

## 2019-08-07 LAB — BASIC METABOLIC PANEL
Anion gap: 9 (ref 5–15)
BUN: 11 mg/dL (ref 6–20)
CO2: 22 mmol/L (ref 22–32)
Calcium: 9.2 mg/dL (ref 8.9–10.3)
Chloride: 106 mmol/L (ref 98–111)
Creatinine, Ser: 0.58 mg/dL (ref 0.44–1.00)
GFR calc Af Amer: >60 mL/min (ref >60)
GFR calc non Af Amer: >60 mL/min (ref >60)
Glucose, Bld: 97 mg/dL (ref 70–99)
Potassium: 3.5 mmol/L (ref 3.5–5.1)
Sodium: 137 mmol/L (ref 135–145)

## 2019-08-07 LAB — CBC
HCT: 38.2 % (ref 36.0–46.0)
Hemoglobin: 13.2 g/dL (ref 12.0–15.0)
MCH: 31.3 pg (ref 26.0–34.0)
MCHC: 34.6 g/dL (ref 30.0–36.0)
MCV: 90.5 fL (ref 80.0–100.0)
Platelets: 165 10*3/uL (ref 150–400)
RBC: 4.22 MIL/uL (ref 3.87–5.11)
RDW: 13 % (ref 11.5–15.5)
WBC: 7.9 10*3/uL (ref 4.0–10.5)
nRBC: 0 % (ref 0.0–0.2)

## 2019-08-07 LAB — C-REACTIVE PROTEIN: CRP: 2 mg/dL — ABNORMAL HIGH (ref <1.0)

## 2019-08-07 LAB — I-STAT BETA HCG BLOOD, ED (MC, WL, AP ONLY): I-stat hCG, quantitative: <5 m[IU]/mL (ref <5)

## 2019-08-07 LAB — TROPONIN I (HIGH SENSITIVITY): Troponin I (High Sensitivity): <2 ng/L (ref <18)

## 2019-08-07 LAB — D-DIMER, QUANTITATIVE: D-Dimer, Quant: <0.27 ug/mL-FEU (ref 0.00–0.50)

## 2019-08-07 MED ORDER — KETOROLAC TROMETHAMINE 30 MG/ML IJ SOLN
30.0000 mg | Freq: Once | INTRAMUSCULAR | Status: AC
  Administered 2019-08-07: 11:00:00 30 mg via INTRAVENOUS
  Filled 2019-08-07: qty 1

## 2019-08-07 MED ORDER — IBUPROFEN 600 MG PO TABS: 600.0000 mg | ORAL_TABLET | Freq: Three times a day (TID) | ORAL | 0 refills | Status: DC

## 2019-08-07 MED ORDER — COLCHICINE 0.6 MG PO TABS: 0.6000 mg | ORAL_TABLET | Freq: Every day | ORAL | 0 refills | Status: DC

## 2019-08-07 NOTE — ED Provider Notes (Signed)
[facility] EMERGENCY DEPARTMENT Provider Note   CSN: [id number] Arrival date & time: 08/07/19  1005     History Chief Complaint  Patient presents with  . Chest Pain    Brenda Carlson is a 24 y.o. female.   24 y/o female presents to the ED for constant, pleuritic chest pain x 2 days. Pain is sharp, aggravated when breathing or lying flat. Is somewhat improved when sitting up. Has had to sleep on an incline over the past few days since symptoms began, but overall pain makes it difficult to sleep at all. No medications taken PTA. Symptoms associated with some SOB, though no distinct DOE. Did have a 5-minute episodes of paresthesias on her R side yesterday which resolved spontaneously and have not recurred. Denies fevers, hemoptysis, leg swelling, syncope. Remote hx of COVID infection; tested positive on 07/18/19 and negative on 08/03/19. No previous hx of VTE.   Chest Pain      Past Medical History:  Diagnosis Date  . Medical history non-contributory   . Pregnancy     Patient Active Problem List   Diagnosis Date Noted  . Vaginal delivery 08/23/2018  . Abnormal Pap smear of cervix 03/03/2018  . Marijuana use 03/02/2018  . Supervision of normal pregnancy 03/01/2018  . Rh negative state in antepartum period 03/01/2018    Past Surgical History:  Procedure Laterality Date  . NO PAST SURGERIES       OB History    Gravida  2   Para  2   Term  2   Preterm      AB      Living  2     SAB      TAB      Ectopic      Multiple  0   Live Births  2           Family History  Problem Relation Age of Onset  . Varicose Veins Mother   . Diabetes Mother   . Cystic fibrosis Sister   . Diabetes Paternal Grandmother   . Diabetes Paternal Grandfather     Social History   Tobacco Use  . Smoking status: Never Smoker  . Smokeless tobacco: Never Used  Substance Use Topics  . Alcohol use: Never  . Drug use: Never    Home Medications Prior to Admission  medications   Medication Sig Start Date End Date Taking? Authorizing Provider  amoxicillin-clavulanate (AUGMENTIN) 875-125 MG tablet Take 1 tablet by mouth 2 (two) times daily. One po bid x 7 days Patient not taking: Reported on 08/07/2019 06/18/19   Arthor Captain, PA-C  colchicine 0.6 MG tablet Take 1 tablet (0.6 mg total) by mouth daily. 08/07/19   Antony Madura, PA-C  ibuprofen (ADVIL) 600 MG tablet Take 1 tablet (600 mg total) by mouth 3 (three) times daily. 08/07/19   Antony Madura, PA-C    Allergies    Patient has no known allergies.  Review of Systems   Review of Systems  Cardiovascular: Positive for chest pain.    Physical Exam Updated Vital Signs BP 107/73   Pulse 69   Temp 98.3 F (36.8 C) (Oral)   Resp 13   Ht 5\' 7"  (1.702 m)   Wt 68 kg   LMP 07/17/2019   SpO2 99%   BMI 23.49 kg/m   Physical Exam  ED Results / Procedures / Treatments   Labs (all labs ordered are listed, but only abnormal results are displayed) Labs Reviewed  C-REACTIVE  PROTEIN - Abnormal; Notable for the following components:      Result Value   CRP 2.0 (*)    All other components within normal limits  CBC  D-DIMER, QUANTITATIVE (NOT AT [facility])  BASIC METABOLIC PANEL  I-STAT BETA HCG BLOOD, ED ([facility], [facility], [facility] ONLY)  TROPONIN I (HIGH SENSITIVITY)    EKG EKG Interpretation  Date/Time:  Sunday August 07 2019 10:20:49 EST Ventricular Rate:  81 PR Interval:    QRS Duration: 89 QT Interval:  364 QTC Calculation: 423 R Axis:   85 Text Interpretation: Sinus rhythm ST elev, probable normal early repol pattern No previous ECGs available Confirmed by Zackowski, Scott ([id number]) on 08/07/2019 10:24:18 AM   Radiology DG Chest 2 View  Result Date: 08/07/2019 CLINICAL DATA:  Chest pain and shortness of breath EXAM: CHEST - 2 VIEW COMPARISON:  None. FINDINGS: Lungs are clear. Heart size and pulmonary vascularity are normal. No adenopathy. No pneumothorax. No bone lesions. IMPRESSION: No abnormality  noted. Electronically Signed   By: William  Woodruff III M.D.   On: 08/07/2019 11:31    Procedures Procedures (including critical care time)  Medications Ordered in ED Medications  ketorolac (TORADOL) 30 MG/ML injection 30 mg (30 mg Intravenous Given 08/07/19 1051)    ED Course  I have reviewed the triage vital signs and the nursing notes.  Pertinent labs & imaging results that were available during my care of the patient were reviewed by me and considered in my medical decision making (see chart for details).  10:43 AM Overall, symptoms sound most consistent with post viral pericarditis.  EKG did show ST elevations; however, this is mild and favored to represent an early repolarization pattern.  Will obtain troponin to assess for possible myocarditis.  D-dimer also ordered to assess for pulmonary embolus secondary to recent Covid infection.  Presently VSS without tachycardia, hypoxia.  11:49 AM Labs reassuring. Negative troponin and D dimer. CRP added for trending of presumed post-viral pericarditis. Will start on Colchicine, NSAIDs. Patient to require PCP follow up in 1 week.     MDM Rules/Calculators/A&P                      23  year old female presenting for sharp, pleuritic chest pain x2 days.  Recent Covid infection, testing negative on Wednesday.  Her symptoms are aggravated when lying flat, slightly improved when leaning forward.  Clinically, I feel she has a post viral pericarditis.  Her EKG is reassuring without signs of acute ischemic change.  Negative troponin; doubt acute myocarditis.  Her D-dimer is negative and she has not had any leg swelling, tachycardia, tachypnea, dyspnea, hypoxia.  Chest x-ray without acute cardiopulmonary abnormality.  Plan for discharge on course of NSAIDs.  We will have the patient follow-up with primary care and/or cardiology, especially for trending of CRP.  Return precautions discussed and provided. Patient discharged in stable condition with no  unaddressed concerns.  Vitals:   08/07/19 1045 08/07/19 1100 08/07/19 1130 08/07/19 1200  BP:  117/73 105/68 107/73  Pulse: 75 89 71 69  Resp: 11 11 10 13   Temp:      TempSrc:      SpO2: 100% 98% 100% 99%  Weight:      Height:        Final Clinical Impression(s) / ED Diagnoses Final diagnoses:  Acute pericarditis, unspecified type    Rx / DC Orders ED Discharge Orders         Ordered  colchicine 0.6 MG tablet  Daily     08/07/19 1203    ibuprofen (ADVIL) 600 MG tablet  3 times daily     08/07/19 1203           Antony Madura, PA-C 08/07/19 1227    Vanetta Mulders, MD 08/19/19 1510

## 2019-08-07 NOTE — ED Triage Notes (Signed)
Pt states that she has been having chest pains since Friday. She states that her right side went numb last night. She is also having some sob.

## 2019-08-07 NOTE — Discharge Instructions (Signed)
Today, take 1 tablet Colchicine 0.6mg  in the morning and 1 tablet in the evening. Tomorrow, continue only 1 tablet Colchicine 0.6mg  once per day (as prescribed).  Take ibuprofen three times a day, as prescribed.  Follow up with a primary care doctor and/or Cardiologist to ensure resolution of symptoms.

## 2019-11-11 ENCOUNTER — Encounter (HOSPITAL_COMMUNITY): Payer: Self-pay

## 2019-11-11 ENCOUNTER — Encounter (HOSPITAL_COMMUNITY): Payer: Self-pay | Admitting: Emergency Medicine

## 2019-11-11 ENCOUNTER — Emergency Department (HOSPITAL_COMMUNITY)
Admission: EM | Admit: 2019-11-11 | Discharge: 2019-11-11 | Disposition: A | Discharge status: Home / Self Care | Payer: Medicaid Other | Attending: Emergency Medicine | Admitting: Emergency Medicine

## 2019-11-11 ENCOUNTER — Emergency Department (HOSPITAL_COMMUNITY): Payer: Medicaid Other

## 2019-11-11 ENCOUNTER — Emergency Department (HOSPITAL_COMMUNITY)
Admission: EM | Admit: 2019-11-11 | Discharge: 2019-11-12 | Disposition: A | Discharge status: Home / Self Care | Payer: Medicaid Other | Source: Home / Self Care | Attending: Emergency Medicine | Admitting: Emergency Medicine

## 2019-11-11 ENCOUNTER — Other Ambulatory Visit: Payer: Self-pay

## 2019-11-11 DIAGNOSIS — N838 Other noninflammatory disorders of ovary, fallopian tube and broad ligament: Secondary | ICD-10-CM | POA: Diagnosis not present

## 2019-11-11 DIAGNOSIS — N83201 Unspecified ovarian cyst, right side: Secondary | ICD-10-CM | POA: Diagnosis not present

## 2019-11-11 DIAGNOSIS — N83209 Unspecified ovarian cyst, unspecified side: Secondary | ICD-10-CM

## 2019-11-11 DIAGNOSIS — R102 Pelvic and perineal pain: Secondary | ICD-10-CM

## 2019-11-11 DIAGNOSIS — Z791 Long term (current) use of non-steroidal anti-inflammatories (NSAID): Secondary | ICD-10-CM | POA: Insufficient documentation

## 2019-11-11 DIAGNOSIS — R1031 Right lower quadrant pain: Secondary | ICD-10-CM | POA: Insufficient documentation

## 2019-11-11 DIAGNOSIS — Z79899 Other long term (current) drug therapy: Secondary | ICD-10-CM | POA: Diagnosis not present

## 2019-11-11 DIAGNOSIS — A599 Trichomoniasis, unspecified: Secondary | ICD-10-CM

## 2019-11-11 HISTORY — DX: Calculus of kidney: N20.0

## 2019-11-11 LAB — CBC
HCT: 33.8 % — ABNORMAL LOW (ref 36.0–46.0)
Hemoglobin: 11.7 g/dL — ABNORMAL LOW (ref 12.0–15.0)
MCH: 31.5 pg (ref 26.0–34.0)
MCHC: 34.6 g/dL (ref 30.0–36.0)
MCV: 91.1 fL (ref 80.0–100.0)
Platelets: 361 10*3/uL (ref 150–400)
RBC: 3.71 MIL/uL — ABNORMAL LOW (ref 3.87–5.11)
RDW: 12.4 % (ref 11.5–15.5)
WBC: 8.3 10*3/uL (ref 4.0–10.5)
nRBC: 0 % (ref 0.0–0.2)

## 2019-11-11 LAB — URINALYSIS, ROUTINE W REFLEX MICROSCOPIC
Bacteria, UA: NONE SEEN
Bilirubin Urine: NEGATIVE
Glucose, UA: NEGATIVE mg/dL
Ketones, ur: 20 mg/dL — AB
Nitrite: NEGATIVE
Protein, ur: 30 mg/dL — AB
Specific Gravity, Urine: 1.02 (ref 1.005–1.030)
pH: 6 (ref 5.0–8.0)

## 2019-11-11 LAB — COMPREHENSIVE METABOLIC PANEL
ALT: 12 U/L (ref 0–44)
AST: 13 U/L — ABNORMAL LOW (ref 15–41)
Albumin: 3.6 g/dL (ref 3.5–5.0)
Alkaline Phosphatase: 69 U/L (ref 38–126)
Anion gap: 9 (ref 5–15)
BUN: 9 mg/dL (ref 6–20)
CO2: 24 mmol/L (ref 22–32)
Calcium: 8.7 mg/dL — ABNORMAL LOW (ref 8.9–10.3)
Chloride: 106 mmol/L (ref 98–111)
Creatinine, Ser: 0.5 mg/dL (ref 0.44–1.00)
GFR calc Af Amer: >60 mL/min (ref >60)
GFR calc non Af Amer: >60 mL/min (ref >60)
Glucose, Bld: 104 mg/dL — ABNORMAL HIGH (ref 70–99)
Potassium: 3.6 mmol/L (ref 3.5–5.1)
Sodium: 139 mmol/L (ref 135–145)
Total Bilirubin: 0.6 mg/dL (ref 0.3–1.2)
Total Protein: 6.5 g/dL (ref 6.5–8.1)

## 2019-11-11 LAB — CBC WITH DIFFERENTIAL/PLATELET
Abs Immature Granulocytes: 0.02 10*3/uL (ref 0.00–0.07)
Basophils Absolute: 0 10*3/uL (ref 0.0–0.1)
Basophils Relative: 0 %
Eosinophils Absolute: 0.2 10*3/uL (ref 0.0–0.5)
Eosinophils Relative: 2 %
HCT: 31.8 % — ABNORMAL LOW (ref 36.0–46.0)
Hemoglobin: 11 g/dL — ABNORMAL LOW (ref 12.0–15.0)
Immature Granulocytes: 0 %
Lymphocytes Relative: 17 %
Lymphs Abs: 1.4 10*3/uL (ref 0.7–4.0)
MCH: 32 pg (ref 26.0–34.0)
MCHC: 34.6 g/dL (ref 30.0–36.0)
MCV: 92.4 fL (ref 80.0–100.0)
Monocytes Absolute: 0.9 10*3/uL (ref 0.1–1.0)
Monocytes Relative: 11 %
Neutro Abs: 5.7 10*3/uL (ref 1.7–7.7)
Neutrophils Relative %: 70 %
Platelets: 310 10*3/uL (ref 150–400)
RBC: 3.44 MIL/uL — ABNORMAL LOW (ref 3.87–5.11)
RDW: 12.5 % (ref 11.5–15.5)
WBC: 8.3 10*3/uL (ref 4.0–10.5)
nRBC: 0 % (ref 0.0–0.2)

## 2019-11-11 LAB — LIPASE, BLOOD: Lipase: 15 U/L (ref 11–51)

## 2019-11-11 LAB — POC URINE PREG, ED: Preg Test, Ur: NEGATIVE

## 2019-11-11 LAB — I-STAT BETA HCG BLOOD, ED (MC, WL, AP ONLY): I-stat hCG, quantitative: <5 m[IU]/mL (ref <5)

## 2019-11-11 MED ORDER — MORPHINE SULFATE (PF) 4 MG/ML IV SOLN
4.0000 mg | Freq: Once | INTRAVENOUS | Status: AC
  Administered 2019-11-11: 07:00:00 4 mg via INTRAVENOUS
  Filled 2019-11-11: qty 1

## 2019-11-11 MED ORDER — SODIUM CHLORIDE 0.9 % IV BOLUS
1000.0000 mL | Freq: Once | INTRAVENOUS | Status: AC
  Administered 2019-11-11: 07:00:00 1000 mL via INTRAVENOUS

## 2019-11-11 MED ORDER — IOHEXOL 300 MG/ML  SOLN
100.0000 mL | Freq: Once | INTRAMUSCULAR | Status: AC | PRN
  Administered 2019-11-11: 10:00:00 100 mL via INTRAVENOUS

## 2019-11-11 MED ORDER — ONDANSETRON HCL 4 MG/2ML IJ SOLN
4.0000 mg | Freq: Once | INTRAMUSCULAR | Status: AC
  Administered 2019-11-11: 4 mg via INTRAVENOUS
  Filled 2019-11-11: qty 2

## 2019-11-11 MED ORDER — HYDROMORPHONE HCL 1 MG/ML IJ SOLN
0.5000 mg | Freq: Once | INTRAMUSCULAR | Status: AC
  Administered 2019-11-11: 10:00:00 0.5 mg via INTRAVENOUS
  Filled 2019-11-11: qty 1

## 2019-11-11 MED ORDER — SODIUM CHLORIDE 0.9% FLUSH
3.0000 mL | Freq: Once | INTRAVENOUS | Status: AC
  Administered 2019-11-12: 3 mL via INTRAVENOUS

## 2019-11-11 MED ORDER — OXYCODONE HCL 5 MG PO TABS: 5.0000 mg | ORAL_TABLET | ORAL | 0 refills | Status: DC | PRN

## 2019-11-11 NOTE — ED Provider Notes (Signed)
  Provider Note MRN:  [id number]  Arrival date & time: 11/11/19    ED Course and Medical Decision Making  Assumed care from Dr. Lynelle Doctor at shift change.  Significant right lower and right upper quadrant tenderness, question appendicitis versus cholecystitis, awaiting CT scan.  Work-up is reassuring, CT scan reveals some free fluid but no appendicitis.  Patient is without fever, normal vital signs, the best explanation for her symptoms and the CT findings would be a ruptured ovarian cyst with some free fluid or blood causing peritonitis.  There is no signs of an active infectious process.  Patient is appropriate for discharge on a pain regimen with strict return precautions for fever or worsening pain.  Procedures  Final Clinical Impressions(s) / ED Diagnoses     ICD-10-CM   1. Right lower quadrant abdominal pain  R10.31   2. Ruptured ovarian cyst  N83.209     ED Discharge Orders         Ordered    oxyCODONE (ROXICODONE) 5 MG immediate release tablet  Every 4 hours PRN     11/11/19 1141            Discharge Instructions     You were evaluated in the Emergency Department and after careful evaluation, we did not find any emergent condition requiring admission or further testing in the hospital.  Your exam/testing today is overall reassuring.  Your symptoms seem to be due to a ruptured ovarian cyst.  Ovarian cysts are very common, and when they rupture it can cause irritation and inflammation within the abdomen and we can be very painful.  Symptoms should go away on their own.  Please use Tylenol 1000 mg every 4-6 hours.  We also recommend ibuprofen 600 mg every 4-6 hours.  For more significant pain you can use the oxycodone medication provided.  Please return to the Emergency Department if you experience any worsening of your condition or fever.  We encourage you to follow up with a primary care provider.  Thank you for allowing [place] to be a part of your care.     Elmer Sow. Pilar Plate, MD  [facility] Emergency Medicine [facility] [email or web address]    Sabas Sous, MD 11/11/19 1143

## 2019-11-11 NOTE — ED Triage Notes (Signed)
Pt reports right sided flank pain intermittently for couple of days, pain has progressively gotten worse and is "causing nausea" per pt. Denies urinary sx. Pt has hx of kidney stones.

## 2019-11-11 NOTE — ED Provider Notes (Signed)
[facility] EMERGENCY DEPARTMENT Provider Note   CSN: [id number] Arrival date & time: 11/11/19  0551   Time seen 6:20 AM  History Chief Complaint  Patient presents with  . Flank Pain    right side    Brenda Carlson is a 24 y.o. female.  HPI Patient states over the weekend, April 10 or 11 she started having pain in her right lower quadrant that has been there constantly.  She states the pain is getting progressively worse.  She states walking, breathing, and coughing is extremely painful.  She denies fever but states she feels lightheaded.  She had some nausea this morning but no vomiting.  She states the pain does not radiate but stays in her right lower quadrant.  She denies dysuria, hematuria, or frequency.  She states she has had kidney stones before and it is sort of similar.  She also however reports loss of appetite.  PCP Patient, No Pcp Per     Past Medical History:  Diagnosis Date  . Kidney stones    kidney stones  . Medical history non-contributory   . Pregnancy     Patient Active Problem List   Diagnosis Date Noted  . Vaginal delivery 08/23/2018  . Abnormal Pap smear of cervix 03/03/2018  . Marijuana use 03/02/2018  . Supervision of normal pregnancy 03/01/2018  . Rh negative state in antepartum period 03/01/2018    Past Surgical History:  Procedure Laterality Date  . NO PAST SURGERIES       OB History    Gravida  2   Para  2   Term  2   Preterm      AB      Living  2     SAB      TAB      Ectopic      Multiple  0   Live Births  2           Family History  Problem Relation Age of Onset  . Varicose Veins Mother   . Diabetes Mother   . Cystic fibrosis Sister   . Diabetes Paternal Grandmother   . Diabetes Paternal Grandfather     Social History   Tobacco Use  . Smoking status: Never Smoker  . Smokeless tobacco: Never Used  Substance Use Topics  . Alcohol use: Never  . Drug use: Never    Home Medications Prior to  Admission medications   Medication Sig Start Date End Date Taking? Authorizing Provider  amoxicillin-clavulanate (AUGMENTIN) 875-125 MG tablet Take 1 tablet by mouth 2 (two) times daily. One po bid x 7 days Patient not taking: Reported on 08/07/2019 06/18/19   Arthor Captain, PA-C  colchicine 0.6 MG tablet Take 1 tablet (0.6 mg total) by mouth daily. 08/07/19   Antony Madura, PA-C  ibuprofen (ADVIL) 600 MG tablet Take 1 tablet (600 mg total) by mouth 3 (three) times daily. 08/07/19   Antony Madura, PA-C    Allergies    Patient has no known allergies.  Review of Systems   Review of Systems  All other systems reviewed and are negative.   Physical Exam Updated Vital Signs BP 118/74   Pulse 71   Temp 98.9 F (37.2 C) (Oral)   Resp 18   Ht 5\' 7"  (1.702 m)   Wt 68 kg   SpO2 100%   BMI 23.48 kg/m   Physical Exam Vitals and nursing note reviewed.  Constitutional:      General: She is  in acute distress.     Appearance: Normal appearance.  HENT:     Head: Normocephalic and atraumatic.     Right Ear: External ear normal.     Left Ear: External ear normal.     Mouth/Throat:     Mouth: Mucous membranes are dry.  Eyes:     Extraocular Movements: Extraocular movements intact.     Conjunctiva/sclera: Conjunctivae normal.     Pupils: Pupils are equal, round, and reactive to light.  Cardiovascular:     Rate and Rhythm: Normal rate and regular rhythm.     Pulses: Normal pulses.  Pulmonary:     Effort: Pulmonary effort is normal. No respiratory distress.     Breath sounds: Normal breath sounds.  Abdominal:     Tenderness: There is abdominal tenderness. There is guarding. There is no rebound.     Comments: Patient states just light touch from the stethoscope is painful.  She is extremely painful in the right upper and right lower quadrant.  She states the worst is in the right upper quadrant but it seems to me that she is has more guarding in the right lower quadrant.  Genitourinary:     Comments: Patient is tearful Musculoskeletal:     Cervical back: Normal range of motion.  Skin:    General: Skin is warm and dry.  Neurological:     General: No focal deficit present.     Mental Status: She is alert and oriented to person, place, and time.     Cranial Nerves: No cranial nerve deficit.  Psychiatric:        Mood and Affect: Affect is flat.        Speech: Speech normal.        Behavior: Behavior normal.        Cognition and Memory: Abnormal remote memory:      ED Results / Procedures / Treatments   Labs (all labs ordered are listed, but only abnormal results are displayed)  Labs pending     EKG None  Radiology No results found.  Procedures Procedures (including critical care time)  Medications Ordered in ED Medications  sodium chloride 0.9 % bolus 1,000 mL (has no administration in time range)  morphine 4 MG/ML injection 4 mg (has no administration in time range)  ondansetron (ZOFRAN) injection 4 mg (has no administration in time range)    ED Course  I have reviewed the triage vital signs and the nursing notes.  Pertinent labs & imaging results that were available during my care of the patient were reviewed by me and considered in my medical decision making (see chart for details).    MDM Rules/Calculators/A&P                      Patient exam is consistent with either acute cholecystitis or appendicitis.  The time of onset appears to be a little prolonged to be appendicitis.  However with the loss of appetite and pain with walking it sounds like appendicitis.  She does not relate the pain to eating food and the pain does not radiate into her back making cholecystitis little less likely.  Either way CT scan of the abdomen and pelvis will help delineate what is the cause of her pain.  She was given IV fluids, IV pain and nausea medication.  CT of the abdomen/pelvis with contrast was ordered.  Pt was turned over to Dr Sedonia Small at change of shift to get  results  of her labs and CT scan.    Final Clinical Impression(s) / ED Diagnoses Final diagnoses:  Right lower quadrant abdominal pain    Rx / DC Orders  Disposition pending  Devoria Albe, MD, Concha Pyo, MD 11/11/19 [phone]

## 2019-11-11 NOTE — Discharge Instructions (Addendum)
You were evaluated in the Emergency Department and after careful evaluation, we did not find any emergent condition requiring admission or further testing in the hospital.  Your exam/testing today is overall reassuring.  Your symptoms seem to be due to a ruptured ovarian cyst.  Ovarian cysts are very common, and when they rupture it can cause irritation and inflammation within the abdomen and we can be very painful.  Symptoms should go away on their own.  Please use Tylenol 1000 mg every 4-6 hours.  We also recommend ibuprofen 600 mg every 4-6 hours.  For more significant pain you can use the oxycodone medication provided.  Please return to the Emergency Department if you experience any worsening of your condition or fever.  We encourage you to follow up with a primary care provider.  Thank you for allowing [place] to be a part of your care.

## 2019-11-11 NOTE — ED Triage Notes (Signed)
Pt states she is having right side flank pain and abd pain, was seen today for the same and CT was done, pt states they dc her home with pain medication that is not working.

## 2019-11-12 ENCOUNTER — Emergency Department (HOSPITAL_COMMUNITY): Payer: Medicaid Other

## 2019-11-12 DIAGNOSIS — N838 Other noninflammatory disorders of ovary, fallopian tube and broad ligament: Secondary | ICD-10-CM | POA: Diagnosis not present

## 2019-11-12 LAB — COMPREHENSIVE METABOLIC PANEL
ALT: 12 U/L (ref 0–44)
AST: 12 U/L — ABNORMAL LOW (ref 15–41)
Albumin: 3.4 g/dL — ABNORMAL LOW (ref 3.5–5.0)
Alkaline Phosphatase: 72 U/L (ref 38–126)
Anion gap: 8 (ref 5–15)
BUN: <5 mg/dL — ABNORMAL LOW (ref 6–20)
CO2: 25 mmol/L (ref 22–32)
Calcium: 9 mg/dL (ref 8.9–10.3)
Chloride: 106 mmol/L (ref 98–111)
Creatinine, Ser: 0.67 mg/dL (ref 0.44–1.00)
GFR calc Af Amer: >60 mL/min (ref >60)
GFR calc non Af Amer: >60 mL/min (ref >60)
Glucose, Bld: 110 mg/dL — ABNORMAL HIGH (ref 70–99)
Potassium: 3.9 mmol/L (ref 3.5–5.1)
Sodium: 139 mmol/L (ref 135–145)
Total Bilirubin: 0.5 mg/dL (ref 0.3–1.2)
Total Protein: 6.7 g/dL (ref 6.5–8.1)

## 2019-11-12 LAB — URINALYSIS, ROUTINE W REFLEX MICROSCOPIC
Bacteria, UA: NONE SEEN
Bilirubin Urine: NEGATIVE
Glucose, UA: NEGATIVE mg/dL
Hgb urine dipstick: NEGATIVE
Ketones, ur: 20 mg/dL — AB
Nitrite: NEGATIVE
Protein, ur: 30 mg/dL — AB
Specific Gravity, Urine: 1.026 (ref 1.005–1.030)
pH: 5 (ref 5.0–8.0)

## 2019-11-12 LAB — LIPASE, BLOOD: Lipase: 19 U/L (ref 11–51)

## 2019-11-12 LAB — WET PREP, GENITAL
Clue Cells Wet Prep HPF POC: NONE SEEN
Sperm: NONE SEEN

## 2019-11-12 MED ORDER — SODIUM CHLORIDE 0.9 % IV BOLUS
1000.0000 mL | Freq: Once | INTRAVENOUS | Status: AC
  Administered 2019-11-12: 1000 mL via INTRAVENOUS

## 2019-11-12 MED ORDER — CEFTRIAXONE SODIUM 500 MG IJ SOLR: 500.0000 mg | Freq: Once | INTRAMUSCULAR | Status: DC

## 2019-11-12 MED ORDER — NAPROXEN 500 MG PO TABS: 500.0000 mg | ORAL_TABLET | Freq: Two times a day (BID) | ORAL | 0 refills | Status: DC | PRN

## 2019-11-12 MED ORDER — DOXYCYCLINE HYCLATE 100 MG PO CAPS: 100.0000 mg | ORAL_CAPSULE | Freq: Two times a day (BID) | ORAL | 0 refills | Status: AC

## 2019-11-12 MED ORDER — KETOROLAC TROMETHAMINE 30 MG/ML IJ SOLN
30.0000 mg | Freq: Once | INTRAMUSCULAR | Status: AC
  Administered 2019-11-12: 30 mg via INTRAVENOUS
  Filled 2019-11-12: qty 1

## 2019-11-12 MED ORDER — DEXTROSE 5 % IV SOLN
500.0000 mg | Freq: Once | INTRAVENOUS | Status: AC
  Administered 2019-11-12: 500 mg via INTRAVENOUS
  Filled 2019-11-12: qty 500

## 2019-11-12 MED ORDER — METRONIDAZOLE 500 MG PO TABS: 500.0000 mg | ORAL_TABLET | Freq: Two times a day (BID) | ORAL | 0 refills | Status: DC

## 2019-11-12 NOTE — Discharge Instructions (Addendum)
Please call your OB/GYN to schedule appointment for ongoing evaluation and management.  I have prescribed you antibiotics, please take as prescribed.  Have also prescribed you naproxen which I would like for you to take for your symptoms of discomfort.  You may continue take the narcotics that were previously prescribed you for breakthrough pain, however please try to control your pain symptoms with NSAIDs first.  Discontinue the naproxen should you become pregnant or breast-feeding.  Please return to the ED or seek immediate medical attention for any new or worsening symptoms.  You have been treated presumptively today for gonorrhea and you have been prescribed medication to cover for chlamydia.  Please take your antibiotic, as prescribed.  Take with food.  You have been tested today for gonorrhea and chlamydia. These results will be available in approximately 3 days. You may check your MyChart account for results. Please inform all sexual partners of positive results and that they should be tested and treated as well.  Please wait 2 weeks and be sure that you and your partners are symptom free before returning to sexual activity. Please use protection with every sexual encounter.  Follow Up: Please followup with your primary doctor in 3 days for discussion of your diagnoses and further evaluation after today's visit; if you do not have a primary care doctor use the resource guide provided to find one; Please return to the ER for worsening symptoms, high fevers or persistent vomiting.

## 2019-11-12 NOTE — ED Provider Notes (Signed)
[facility] EMERGENCY DEPARTMENT Provider Note   CSN: [id number] Arrival date & time: 11/11/19  2248     History Chief Complaint  Patient presents with  . Abdominal Pain    Brenda Carlson is a 24 y.o. female with PMH significant for kidney stones who presents to the ED with complaints of right-sided flank and abdominal pain.  She was evaluated in the ER yesterday for same complaints and CT imaging was obtained and negative for any acute intra-abdominal pathology.  She was discharged home with pain medication, however her symptoms have continued to persist.  Patient reports that she went to the pharmacist to pick up her prescribed pain medications and the pharmacist told her that she should get a second opinion and may require antibiotics.  She states that she has not been able to eat or drink due to the pain and has a lot of discomfort with walking.  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  She states that it hurts to take a deep breath and that she has been having hot and cold flashes.  However, she does not suspect that this is musculoskeletal etiology.  She denies any current nausea or vomiting, dizziness, chest pain, urinary symptoms, vaginal bleeding or discharge, vaginal pain, history of STI, pain with defecation, hematochezia, or other changes in bowel habits.  Patient denies any breast-feeding or pregnancy.  Her last menses ended approximately 4 days ago.  She endorses a history of irregular menses and menorrhagia.  She is not followed by a OB/GYN at this moment due to insurance constraints.  HPI     Past Medical History:  Diagnosis Date  . Kidney stones    kidney stones  . Medical history non-contributory   . Pregnancy     Patient Active Problem List   Diagnosis Date Noted  . Vaginal delivery 08/23/2018  . Abnormal Pap smear of cervix 03/03/2018  . Marijuana use 03/02/2018  .  Supervision of normal pregnancy 03/01/2018  . Rh negative state in antepartum period 03/01/2018    Past Surgical History:  Procedure Laterality Date  . NO PAST SURGERIES       OB History    Gravida  2   Para  2   Term  2   Preterm      AB      Living  2     SAB      TAB      Ectopic      Multiple  0   Live Births  2           Family History  Problem Relation Age of Onset  . Varicose Veins Mother   . Diabetes Mother   . Cystic fibrosis Sister   . Diabetes Paternal Grandmother   . Diabetes Paternal Grandfather     Social History   Tobacco Use  . Smoking status: Never Smoker  . Smokeless tobacco: Never Used  Substance Use Topics  . Alcohol use: Never  . Drug use: Never    Home Medications Prior to Admission medications   Medication Sig Start Date End Date Taking? Authorizing Provider  oxyCODONE (ROXICODONE) 5 MG immediate release tablet Take 1 tablet (5 mg total) by mouth every 4 (four) hours as needed for severe pain. 11/11/19  Yes Sabas Sous, MD  amoxicillin-clavulanate (AUGMENTIN) 875-125 MG tablet Take 1 tablet by mouth 2 (two) times daily. One  po bid x 7 days Patient not taking: Reported on 08/07/2019 06/18/19   Arthor Captain, PA-C  colchicine 0.6 MG tablet Take 1 tablet (0.6 mg total) by mouth daily. Patient not taking: Reported on 11/11/2019 08/07/19   Antony Madura, PA-C  doxycycline (VIBRAMYCIN) 100 MG capsule Take 1 capsule (100 mg total) by mouth 2 (two) times daily for 7 days. 11/12/19 11/19/19  Lorelee New, PA-C  ibuprofen (ADVIL) 600 MG tablet Take 1 tablet (600 mg total) by mouth 3 (three) times daily. Patient not taking: Reported on 11/12/2019 08/07/19   Antony Madura, PA-C  metroNIDAZOLE (FLAGYL) 500 MG tablet Take 1 tablet (500 mg total) by mouth 2 (two) times daily. 11/12/19   Lorelee New, PA-C    Allergies    Patient has no known allergies.  Review of Systems   Review of Systems  All other systems reviewed and are  negative.   Physical Exam Updated Vital Signs BP 128/81   Pulse 66   Temp 98.8 F (37.1 C) (Oral)   Resp 19   Ht 5\' 7"  (1.702 m)   Wt 68 kg   SpO2 100%   BMI 23.48 kg/m   Physical Exam Vitals and nursing note reviewed. Exam conducted with a chaperone present.  Constitutional:      Appearance: Normal appearance.  HENT:     Head: Normocephalic and atraumatic.  Eyes:     General: No scleral icterus.    Conjunctiva/sclera: Conjunctivae normal.  Cardiovascular:     Rate and Rhythm: Normal rate and regular rhythm.     Pulses: Normal pulses.     Heart sounds: Normal heart sounds.  Pulmonary:     Effort: Pulmonary effort is normal.  Abdominal:     Comments: Soft, nondistended.  RLQ TTP.  Positive McBurney's point tenderness.  Mild RUQ tenderness.  No TTP elsewhere.  No guarding.  No overlying skin changes.  Normoactive bowel sounds.  Negative jump test.  Genitourinary:    Comments: Speculum exam: Cervical os is closed.  Does not appear to be friable or erythematous.  Significant thin, darkish milky discharge in vaginal vault. Bimanual exam: No CMT.  No adnexal tenderness. Musculoskeletal:     Cervical back: Normal range of motion and neck supple. No rigidity.  Skin:    General: Skin is dry.  Neurological:     Mental Status: She is alert.     GCS: GCS eye subscore is 4. GCS verbal subscore is 5. GCS motor subscore is 6.  Psychiatric:        Mood and Affect: Mood normal.        Behavior: Behavior normal.        Thought Content: Thought content normal.      ED Results / Procedures / Treatments   Labs (all labs ordered are listed, but only abnormal results are displayed) Labs Reviewed  WET PREP, GENITAL - Abnormal; Notable for the following components:      Result Value   Yeast Wet Prep HPF POC   (*)    Value: Specimen diluted due to transport tube containing more than 1 ml of saline, interpret results with caution.   Trich, Wet Prep PRESENT (*)    WBC, Wet Prep HPF  POC MANY (*)    All other components within normal limits  COMPREHENSIVE METABOLIC PANEL - Abnormal; Notable for the following components:   Glucose, Bld 110 (*)    BUN <5 (*)    Albumin 3.4 (*)  AST 12 (*)    All other components within normal limits  CBC - Abnormal; Notable for the following components:   RBC 3.71 (*)    Hemoglobin 11.7 (*)    HCT 33.8 (*)    All other components within normal limits  URINALYSIS, ROUTINE W REFLEX MICROSCOPIC - Abnormal; Notable for the following components:   APPearance HAZY (*)    Ketones, ur 20 (*)    Protein, ur 30 (*)    Leukocytes,Ua MODERATE (*)    All other components within normal limits  LIPASE, BLOOD  I-STAT BETA HCG BLOOD, ED ([facility], [facility], [facility] ONLY)  GC/CHLAMYDIA PROBE AMP ([place]) NOT AT [facility]    EKG None  Radiology CT Abdomen Pelvis W Contrast  Result Date: 11/11/2019 CLINICAL DATA:  Right lower quadrant pain with appendicitis suspected EXAM: CT ABDOMEN AND PELVIS WITH CONTRAST TECHNIQUE: Multidetector CT imaging of the abdomen and pelvis was performed using the standard protocol following bolus administration of intravenous contrast. CONTRAST:  OMNIPAQUE IOHEXOL 300 MG/ML  SOLN COMPARISON:  None. FINDINGS: Lower chest:  Negative Hepatobiliary: Mild periportal edema usually related to volume resuscitation.No evidence of biliary obstruction or stone. Pancreas: Unremarkable. Spleen: Unremarkable. Adrenals/Urinary Tract: Negative adrenals. No hydronephrosis or stone. Unremarkable bladder. Stomach/Bowel:  No obstruction. No appendicitis. Vascular/Lymphatic: No acute vascular abnormality. Mild prominence of periaortic and mesenteric lymph nodes but overall non worrisome. Reproductive:Dominant cysts/follicles on the left measuring up to 2.4 cm. No definite hydrosalpinx. Other: Small volume pelvic fluid but with peritoneal enhancement. There is also some peritoneal enhancement and heterogeneous density fluid along the right gutter.  Musculoskeletal: No acute abnormalities. IMPRESSION: 1. Small volume pelvic and right gutter fluid with peritoneal inflammation/complexity. Question ruptured hemorrhagic ovarian cyst, PID, or occult intra-abdominal inflammation. 2. Dominant follicles in the left ovary with no definite hydrosalpinx. 3. No appendicitis. Electronically Signed   By: Marnee Spring M.D.   On: 11/11/2019 10:05   US PELVIC COMPLETE W TRANSVAGINAL AND TORSION R/O  Result Date: 11/12/2019 CLINICAL DATA:  Pelvic pain. Recent CT describing abnormalities within the pelvis raising the possibility of ruptured hemorrhagic ovarian cyst and PID. EXAM: TRANSABDOMINAL AND TRANSVAGINAL ULTRASOUND OF PELVIS DOPPLER ULTRASOUND OF OVARIES TECHNIQUE: Both transabdominal and transvaginal ultrasound examinations of the pelvis were performed. Transabdominal technique was performed for global imaging of the pelvis including uterus, ovaries, adnexal regions, and pelvic cul-de-sac. It was necessary to proceed with endovaginal exam following the transabdominal exam to visualize the endometrial complex and adnexal regions adequately. Color and duplex Doppler ultrasound was utilized to evaluate blood flow to the ovaries. COMPARISON:  CT abdomen and pelvis performed on 11/11/2019. FINDINGS: Uterus Measurements: 9.1 x 3.7 x 4.8 cm = volume: 85 mL. No fibroids or other mass visualized. Endometrium Thickness: 2 mm.  No focal abnormality visualized. Right ovary Measurements: 4.3 x 1.9 x 3 cm = volume: 13.1 mL. RIGHT ovary appears normal. Moderate-to-large complex fluid collection within the RIGHT adnexal region, blood products versus infectious. Left ovary Measurements: 7.7 x 4.9 x 6.2 cm = volume: 121 mL. This measurement includes a complex cystic and solid mass which measures 5.6 x 2.9 x 5.6 cm, possibly septated/abutting ovarian hemorrhagic cysts, possibly and abutting abscess or pyosalpinx given its configuration on CT. Pulsed Doppler evaluation of both ovaries  demonstrates normal low-resistance arterial and venous waveforms. Other findings As above. IMPRESSION: 1. Complex cystic and solid mass in the LEFT adnexal region, measuring 5.6 cm greatest dimension, contiguous with the LEFT ovary so compatible with septated/abutting ovarian  hemorrhagic cysts versus abutting abscess or pyosalpinx given its configuration on CT. In the absence of fever or white count, would favor ruptured ovarian hemorrhagic cysts. 2. Moderate-to-large complex fluid collection within the RIGHT adnexal region, blood products versus infectious. Again, in the absence of fever, would favor ruptured hemorrhagic cyst. 3. No evidence of ovarian torsion. 4. Uterus appears normal. Electronically Signed   By: Franki Cabot M.D.   On: 11/12/2019 11:21    Procedures Procedures (including critical care time)  Medications Ordered in ED Medications  cefTRIAXone (ROCEPHIN) 500 mg in dextrose 5 % 50 mL IVPB (500 mg Intravenous Bolus from Bag 11/12/19 1145)  sodium chloride flush (NS) 0.9 % injection 3 mL (3 mLs Intravenous Given 11/12/19 0716)  sodium chloride 0.9 % bolus 1,000 mL (0 mLs Intravenous Stopped 11/12/19 0938)  ketorolac (TORADOL) 30 MG/ML injection 30 mg (30 mg Intravenous Given 11/12/19 3149)    ED Course  I have reviewed the triage vital signs and the nursing notes.  Pertinent labs & imaging results that were available during my care of the patient were reviewed by me and considered in my medical decision making (see chart for details).    MDM Rules/Calculators/A&P                      Given chronicity of patient's right upper quadrant abdominal discomfort in conjunction with her reassuring laboratory work-up and vital signs, low suspicion for appendicitis.  No appendicitis visualized on CT imaging obtained yesterday.  Will perform pelvic exam and obtain pelvic ultrasound to assess for ovarian torsion versus other pelvic abnormalities.  Patient does endorse a history of ovarian  cysts.    Pelvic exam was significant for moderate thin, darkish milky discharge.  No tenderness.  Cervix appeared normal.  Wet prep was significant for trichomoniasis and many white blood cells.  We will treat for GC empirically.  Patient reports that she slept with an ex who has multiple partners.  Patient denies any dysuria or increased urinary frequency.   US demonstrates complex septated cystic mass on left side.  Patient is denying any left-sided abdominal discomfort and there is no tenderness on my exam.  Given her lack of fevers and normal WBC, low suspicion for tubo-ovarian abscess.  Moderate to large fluid collection in the right adnexal region, suspicious for ruptured hemorrhagic cyst.  This would explain her symptoms of discomfort.  We will continue to treat with analgesics in addition to the newly prescribed antibiotics.  Encouraging patient to get established with an OB/GYN soon as possible for ongoing evaluation management.  She sees [facility] in [place], [place].  Strict ED return precautions discussed.  All of the evaluation and work-up results were discussed with the patient and any family at bedside. They were provided opportunity to ask any additional questions and have none at this time. They have expressed understanding of verbal discharge instructions as well as return precautions and are agreeable to the plan.    Final Clinical Impression(s) / ED Diagnoses Final diagnoses:  Ruptured ovarian cyst  Trichimoniasis    Rx / DC Orders ED Discharge Orders         Ordered    metroNIDAZOLE (FLAGYL) 500 MG tablet  2 times daily     11/12/19 0958    doxycycline (VIBRAMYCIN) 100 MG capsule  2 times daily     11/12/19 0958           Corena Herter, PA-C 11/12/19 1206  Pricilla LovelessGoldston, Scott, MD 11/14/19 1704

## 2019-11-12 NOTE — ED Notes (Signed)
Pt transported to US

## 2019-11-14 LAB — GC/CHLAMYDIA PROBE AMP (~~LOC~~) NOT AT ARMC
Chlamydia: POSITIVE — AB
Comment: NEGATIVE
Comment: NORMAL
Neisseria Gonorrhea: NEGATIVE

## 2020-01-24 ENCOUNTER — Ambulatory Visit (INDEPENDENT_AMBULATORY_CARE_PROVIDER_SITE_OTHER): Payer: Medicaid Other | Admitting: Women's Health

## 2020-01-24 ENCOUNTER — Encounter: Payer: Self-pay | Admitting: Women's Health

## 2020-01-24 ENCOUNTER — Other Ambulatory Visit (HOSPITAL_COMMUNITY)
Admission: RE | Admit: 2020-01-24 | Discharge: 2020-01-24 | Disposition: A | Discharge status: Home / Self Care | Payer: Medicaid Other | Source: Ambulatory Visit | Attending: Obstetrics & Gynecology | Admitting: Obstetrics & Gynecology

## 2020-01-24 ENCOUNTER — Other Ambulatory Visit: Payer: Self-pay

## 2020-01-24 VITALS — BP 132/67 | HR 86 | Ht 67.0 in | Wt 146.0 lb

## 2020-01-24 DIAGNOSIS — Z30013 Encounter for initial prescription of injectable contraceptive: Secondary | ICD-10-CM | POA: Diagnosis not present

## 2020-01-24 DIAGNOSIS — Z113 Encounter for screening for infections with a predominantly sexual mode of transmission: Secondary | ICD-10-CM | POA: Diagnosis not present

## 2020-01-24 DIAGNOSIS — R300 Dysuria: Secondary | ICD-10-CM

## 2020-01-24 DIAGNOSIS — Z8742 Personal history of other diseases of the female genital tract: Secondary | ICD-10-CM

## 2020-01-24 DIAGNOSIS — N3001 Acute cystitis with hematuria: Secondary | ICD-10-CM | POA: Diagnosis not present

## 2020-01-24 LAB — POCT URINALYSIS DIPSTICK OB
Glucose, UA: NEGATIVE
Nitrite, UA: POSITIVE

## 2020-01-24 MED ORDER — MEDROXYPROGESTERONE ACETATE 150 MG/ML IM SUSP: 150.0000 mg | INTRAMUSCULAR | 3 refills | Status: DC

## 2020-01-24 MED ORDER — NITROFURANTOIN MONOHYD MACRO 100 MG PO CAPS: 100.0000 mg | ORAL_CAPSULE | Freq: Two times a day (BID) | ORAL | 0 refills | Status: DC

## 2020-01-24 NOTE — Patient Instructions (Signed)
Medroxyprogesterone injection [Contraceptive] What is this medicine? MEDROXYPROGESTERONE (me DROX ee proe JES te rone) contraceptive injections prevent pregnancy. They provide effective birth control for 3 months. Depo-subQ Provera 104 is also used for treating pain related to endometriosis. This medicine may be used for other purposes; ask your health care provider or pharmacist if you have questions. COMMON BRAND NAME(S): Depo-Provera, Depo-subQ Provera 104 What should I tell my health care provider before I take this medicine? They need to know if you have any of these conditions:  frequently drink alcohol  asthma  blood vessel disease or a history of a blood clot in the lungs or legs  bone disease such as osteoporosis  breast cancer  diabetes  eating disorder (anorexia nervosa or bulimia)  high blood pressure  HIV infection or AIDS  kidney disease  liver disease  mental depression  migraine  seizures (convulsions)  stroke  tobacco smoker  vaginal bleeding  an unusual or allergic reaction to medroxyprogesterone, other hormones, medicines, foods, dyes, or preservatives  pregnant or trying to get pregnant  breast-feeding How should I use this medicine? Depo-Provera Contraceptive injection is given into a muscle. Depo-subQ Provera 104 injection is given under the skin. These injections are given by a health care professional. You must not be pregnant before getting an injection. The injection is usually given during the first 5 days after the start of a menstrual period or 6 weeks after delivery of a baby. Talk to your pediatrician regarding the use of this medicine in children. Special care may be needed. These injections have been used in female children who have started having menstrual periods. Overdosage: If you think you have taken too much of this medicine contact a poison control center or emergency room at once. NOTE: This medicine is only for you. Do not  share this medicine with others. What if I miss a dose? Try not to miss a dose. You must get an injection once every 3 months to maintain birth control. If you cannot keep an appointment, call and reschedule it. If you wait longer than 13 weeks between Depo-Provera contraceptive injections or longer than 14 weeks between Depo-subQ Provera 104 injections, you could get pregnant. Use another method for birth control if you miss your appointment. You may also need a pregnancy test before receiving another injection. What may interact with this medicine? Do not take this medicine with any of the following medications:  bosentan This medicine may also interact with the following medications:  aminoglutethimide  antibiotics or medicines for infections, especially rifampin, rifabutin, rifapentine, and griseofulvin  aprepitant  barbiturate medicines such as phenobarbital or primidone  bexarotene  carbamazepine  medicines for seizures like ethotoin, felbamate, oxcarbazepine, phenytoin, topiramate  modafinil  St. John's wort This list may not describe all possible interactions. Give your health care provider a list of all the medicines, herbs, non-prescription drugs, or dietary supplements you use. Also tell them if you smoke, drink alcohol, or use illegal drugs. Some items may interact with your medicine. What should I watch for while using this medicine? This drug does not protect you against HIV infection (AIDS) or other sexually transmitted diseases. Use of this product may cause you to lose calcium from your bones. Loss of calcium may cause weak bones (osteoporosis). Only use this product for more than 2 years if other forms of birth control are not right for you. The longer you use this product for birth control the more likely you will be at risk   for weak bones. Ask your health care professional how you can keep strong bones. You may have a change in bleeding pattern or irregular periods.  Many females stop having periods while taking this drug. If you have received your injections on time, your chance of being pregnant is very low. If you think you may be pregnant, see your health care professional as soon as possible. Tell your health care professional if you want to get pregnant within the next year. The effect of this medicine may last a long time after you get your last injection. What side effects may I notice from receiving this medicine? Side effects that you should report to your doctor or health care professional as soon as possible:  allergic reactions like skin rash, itching or hives, swelling of the face, lips, or tongue  breast tenderness or discharge  breathing problems  changes in vision  depression  feeling faint or lightheaded, falls  fever  pain in the abdomen, chest, groin, or leg  problems with balance, talking, walking  unusually weak or tired  yellowing of the eyes or skin Side effects that usually do not require medical attention (report to your doctor or health care professional if they continue or are bothersome):  acne  fluid retention and swelling  headache  irregular periods, spotting, or absent periods  temporary pain, itching, or skin reaction at site where injected  weight gain This list may not describe all possible side effects. Call your doctor for medical advice about side effects. You may report side effects to FDA at 1-800-FDA-1088. Where should I keep my medicine? This does not apply. The injection will be given to you by a health care professional. NOTE: This sheet is a summary. It may not cover all possible information. If you have questions about this medicine, talk to your doctor, pharmacist, or health care provider.  2020 Elsevier/Gold Standard (2008-08-04 18:37:56)  

## 2020-01-24 NOTE — Progress Notes (Signed)
GYN VISIT Patient name: Brenda Carlson MRN [id number]  Date of birth: 1995-10-01 Chief Complaint:   Urinary Tract Infection (also wants to be tested for std)  History of Present Illness:   Brenda Carlson is a 24 y.o. [id number] Caucasian female being seen today for report of urinary frequency and blood x 1wk, no dysuria or other sx.  Dx w/ trichomonas 4/17, took 7d of 10d treatment rx'd b/c she went to jail. Did have sex w/ partner again even though he refused to be treated. Denies abnormal discharge, itching/odor/irritation.  Wants to get on birth control- either depo or IUD. Discussed both, wants to gain weight, so wants to try depo. Last sex ~50mth ago. Depression screen PHQ 2/9 03/01/2018  Decreased Interest 0  Down, Depressed, Hopeless 0  PHQ - 2 Score 0  Altered sleeping 0  Tired, decreased energy 0  Change in appetite 0  Feeling bad or failure about yourself  0  Trouble concentrating 0  Moving slowly or fidgety/restless 0  Suicidal thoughts 0  PHQ-9 Score 0    Patient's last menstrual period was 01/10/2020. The current method of family planning is none.  Last pap 03/01/18. Results were:  abnormal  ASCUS w/ -HRHPV Review of Systems:   Pertinent items are noted in HPI Denies fever/chills, dizziness, headaches, visual disturbances, fatigue, shortness of breath, chest pain, abdominal pain, vomiting, abnormal vaginal discharge/itching/odor/irritation, problems with periods, bowel movements, urination, or intercourse unless otherwise stated above.  Pertinent History Reviewed:  Reviewed past medical,surgical, social, obstetrical and family history.  Reviewed problem list, medications and allergies. Physical Assessment:   Vitals:   01/24/20 1435  BP: 132/67  Pulse: 86  Weight: 146 lb (66.2 kg)  Height: 5\' 7"  (1.702 m)  Body mass index is 22.87 kg/m.       Physical Examination:   General appearance: alert, well appearing, and in no distress  Mental status: alert, oriented to  person, place, and time  Skin: warm & dry   Cardiovascular: normal heart rate noted  Respiratory: normal respiratory effort, no distress  Abdomen: soft, non-tender   Pelvic: VULVA: normal appearing vulva with no masses, tenderness or lesions, VAGINA: normal appearing vagina with normal color and discharge, no lesions, CERVIX: normal appearing cervix without discharge or lesions  Extremities: no edema   Chaperone: Amanda Rash    Results for orders placed or performed in visit on 01/24/20 (from the past 24 hour(s))  POC Urinalysis Dipstick OB   Collection Time: 01/24/20  2:39 PM  Result Value Ref Range   Color, UA     Clarity, UA     Glucose, UA Negative Negative   Bilirubin, UA     Ketones, UA small    Spec Grav, UA     Blood, UA small    pH, UA     POC,PROTEIN,UA Moderate (2+) Negative, Trace, Small (1+), Moderate (2+), Large (3+), 4+   Urobilinogen, UA     Nitrite, UA pos    Leukocytes, UA Small (1+) (A) Negative   Appearance     Odor      Assessment & Plan:  1) UTI> rx macrobid, send cx  2) STD screen/trichomonas POC> send CV swab  3) Contraception management> rx depo, come today or tomorrow for injection, no sex until after injection, condoms x 2wks, and recommend always for STD prevention  4) H/O abnormal pap> needs repeat in 62yr  Meds:  Meds ordered this encounter  Medications  . nitrofurantoin, macrocrystal-monohydrate, (MACROBID) 100 MG  capsule    Sig: Take 1 capsule (100 mg total) by mouth 2 (two) times daily. X 7 days    Dispense:  14 capsule    Refill:  0    Order Specific Question:   Supervising Provider    Answer:   EURE, LUTHER H [[id number]]  . medroxyPROGESTERone (DEPO-PROVERA) 150 MG/ML injection    Sig: Inject 1 mL (150 mg total) into the muscle every 3 (three) months.    Dispense:  1 mL    Refill:  3    Order Specific Question:   Supervising Provider    Answer:   Duane Lope H [[id number]]    Orders Placed This Encounter  Procedures  . Urine Culture  .  POC Urinalysis Dipstick OB    Return for today or tomorrow for depo; then 34yr for physical.  Cheral Marker CNM, [facility] 01/24/2020 2:55 PM

## 2020-01-25 ENCOUNTER — Encounter: Payer: Self-pay | Admitting: *Deleted

## 2020-01-25 ENCOUNTER — Ambulatory Visit (INDEPENDENT_AMBULATORY_CARE_PROVIDER_SITE_OTHER): Payer: Medicaid Other | Admitting: *Deleted

## 2020-01-25 DIAGNOSIS — Z308 Encounter for other contraceptive management: Secondary | ICD-10-CM

## 2020-01-25 DIAGNOSIS — Z3042 Encounter for surveillance of injectable contraceptive: Secondary | ICD-10-CM | POA: Diagnosis not present

## 2020-01-25 DIAGNOSIS — Z3202 Encounter for pregnancy test, result negative: Secondary | ICD-10-CM

## 2020-01-25 LAB — POCT URINE PREGNANCY: Preg Test, Ur: NEGATIVE

## 2020-01-25 MED ORDER — MEDROXYPROGESTERONE ACETATE 150 MG/ML IM SUSP
150.0000 mg | Freq: Once | INTRAMUSCULAR | Status: AC
  Administered 2020-01-25: 150 mg via INTRAMUSCULAR

## 2020-01-25 NOTE — Progress Notes (Signed)
° °  NURSE VISIT- INJECTION  SUBJECTIVE:  Brenda Carlson is a 24 y.o. [phone] female here for a Depo Provera for contraception/period management. She is a GYN patient.   OBJECTIVE:  LMP 01/10/2020    Breastfeeding No   Appears well, in no apparent distress  Injection administered in: Right deltoid  Meds ordered this encounter  Medications   medroxyPROGESTERone (DEPO-PROVERA) injection 150 mg    ASSESSMENT: GYN patient Depo Provera for contraception/period management PLAN: Follow-up: in 11-13 weeks for next Depo   [name]  01/25/2020 11:23 AM

## 2020-01-26 LAB — CERVICOVAGINAL ANCILLARY ONLY
Bacterial Vaginitis (gardnerella): POSITIVE — AB
Candida Glabrata: NEGATIVE
Candida Vaginitis: NEGATIVE
Chlamydia: POSITIVE — AB
Comment: NEGATIVE
Comment: NEGATIVE
Comment: NEGATIVE
Comment: NEGATIVE
Comment: NEGATIVE
Comment: NORMAL
Neisseria Gonorrhea: NEGATIVE
Trichomonas: NEGATIVE

## 2020-01-26 LAB — URINE CULTURE

## 2020-01-29 ENCOUNTER — Other Ambulatory Visit: Payer: Self-pay | Admitting: Women's Health

## 2020-01-29 DIAGNOSIS — A599 Trichomoniasis, unspecified: Secondary | ICD-10-CM | POA: Insufficient documentation

## 2020-01-29 DIAGNOSIS — A749 Chlamydial infection, unspecified: Secondary | ICD-10-CM

## 2020-01-29 MED ORDER — AZITHROMYCIN 500 MG PO TABS: 1000.0000 mg | ORAL_TABLET | Freq: Once | ORAL | 0 refills | Status: AC

## 2020-01-29 MED ORDER — METRONIDAZOLE 500 MG PO TABS: 500.0000 mg | ORAL_TABLET | Freq: Two times a day (BID) | ORAL | 0 refills | Status: DC

## 2020-01-31 ENCOUNTER — Other Ambulatory Visit: Payer: Self-pay | Admitting: Women's Health

## 2020-01-31 ENCOUNTER — Telehealth: Payer: Self-pay | Admitting: *Deleted

## 2020-01-31 MED ORDER — AZITHROMYCIN 500 MG PO TABS: 1000.0000 mg | ORAL_TABLET | Freq: Once | ORAL | 0 refills | Status: AC

## 2020-01-31 NOTE — Telephone Encounter (Signed)
Voice mail not set up @ 3:40 pm. No other # listed. Will keep trying. JSY

## 2020-01-31 NOTE — Telephone Encounter (Signed)
-----   Message from Cheral Marker, [place] sent at 01/31/2020  2:10 PM EDT ----- She hasn't read [employer]. Please let her know she has chlamydia, I have rx'd azithromycin to her pharmacy. Partner also needs to be treated. I can treat, or he can go to PCP or HD. If she wants me to treat him, I need name/dob/allergies/pharmacy. No sex until at least 7d from both being treated. Return in 3-4 weeks for urine poc with lab.

## 2020-01-31 NOTE — Telephone Encounter (Signed)
-----   Message from Kimberly R Booker, CNM sent at 01/31/2020  2:10 PM EDT ----- She hasn't read mychart message. Please let her know she has chlamydia, I have rx'd azithromycin to her pharmacy. Partner also needs to be treated. I can treat, or he can go to PCP or HD. If she wants me to treat him, I need name/dob/allergies/pharmacy. No sex until at least 7d from both being treated. Return in 3-4 weeks for urine poc with lab.    

## 2020-02-01 NOTE — Telephone Encounter (Signed)
Pt aware she is + for CHL. Med has been sent to pharmacy. Pt's partner needs to be treated. Pt states she told him but is no longer with him. Pt advised no sex for at least 7 days and POC appt has been scheduled. Pt voiced understanding. JSY

## 2020-02-29 ENCOUNTER — Other Ambulatory Visit: Payer: Self-pay

## 2020-03-02 ENCOUNTER — Other Ambulatory Visit (INDEPENDENT_AMBULATORY_CARE_PROVIDER_SITE_OTHER): Payer: Medicaid Other | Admitting: *Deleted

## 2020-03-02 ENCOUNTER — Other Ambulatory Visit (HOSPITAL_COMMUNITY)
Admission: RE | Admit: 2020-03-02 | Discharge: 2020-03-02 | Disposition: A | Discharge status: Home / Self Care | Payer: Medicaid Other | Source: Ambulatory Visit | Attending: Obstetrics and Gynecology | Admitting: Obstetrics and Gynecology

## 2020-03-02 DIAGNOSIS — A749 Chlamydial infection, unspecified: Secondary | ICD-10-CM | POA: Insufficient documentation

## 2020-03-02 NOTE — Progress Notes (Addendum)
   NURSE VISIT-STD/POC  SUBJECTIVE:  Brenda Carlson is a 24 y.o. [id number] GYN patientfemale here for a vaginal swab for proof of cure after treatment for Chlamydia.  She reports the following symptoms: none for 0 days. Denies abnormal vaginal bleeding, significant pelvic pain, fever, or UTI symptoms.  OBJECTIVE:  There were no vitals taken for this visit.  Appears well, in no apparent distress  ASSESSMENT: Vaginal swab for proof of cure after treatment for CHL  PLAN: Self-collected vaginal probe for Gonorrhea, Chlamydia sent to lab Treatment: to be determined once results are received Follow-up as needed if symptoms persist/worsen, or new symptoms develop  [name]  03/02/2020 11:43 AM   Chart reviewed for nurse visit. Agree with plan of care.  Cheral Marker, [place] 03/02/2020 1:31 PM

## 2020-03-04 LAB — CERVICOVAGINAL ANCILLARY ONLY
Chlamydia: NEGATIVE
Comment: NEGATIVE
Comment: NORMAL
Neisseria Gonorrhea: NEGATIVE

## 2020-04-18 ENCOUNTER — Ambulatory Visit (INDEPENDENT_AMBULATORY_CARE_PROVIDER_SITE_OTHER): Payer: Medicaid Other | Admitting: *Deleted

## 2020-04-18 DIAGNOSIS — Z3042 Encounter for surveillance of injectable contraceptive: Secondary | ICD-10-CM | POA: Diagnosis not present

## 2020-04-18 DIAGNOSIS — Z308 Encounter for other contraceptive management: Secondary | ICD-10-CM

## 2020-04-18 MED ORDER — MEDROXYPROGESTERONE ACETATE 150 MG/ML IM SUSP
150.0000 mg | Freq: Once | INTRAMUSCULAR | Status: AC
  Administered 2020-04-18: 150 mg via INTRAMUSCULAR

## 2020-04-18 NOTE — Progress Notes (Signed)
   NURSE VISIT- INJECTION  SUBJECTIVE:  Brenda Carlson is a 24 y.o. [phone] female here for a Depo Provera for contraception/period management. She is a GYN patient.   OBJECTIVE:  There were no vitals taken for this visit.  Appears well, in no apparent distress  Injection administered in: Right deltoid  Meds ordered this encounter  Medications  . medroxyPROGESTERone (DEPO-PROVERA) injection 150 mg    ASSESSMENT: GYN patient Depo Provera for contraception/period management PLAN: Follow-up: in 11-13 weeks for next Depo   [name]  04/18/2020 11:35 AM

## 2020-07-11 ENCOUNTER — Ambulatory Visit (INDEPENDENT_AMBULATORY_CARE_PROVIDER_SITE_OTHER): Payer: Medicaid Other | Admitting: *Deleted

## 2020-07-11 ENCOUNTER — Other Ambulatory Visit: Payer: Self-pay

## 2020-07-11 DIAGNOSIS — Z3042 Encounter for surveillance of injectable contraceptive: Secondary | ICD-10-CM | POA: Diagnosis not present

## 2020-07-11 DIAGNOSIS — Z308 Encounter for other contraceptive management: Secondary | ICD-10-CM

## 2020-07-11 MED ORDER — MEDROXYPROGESTERONE ACETATE 150 MG/ML IM SUSP
150.0000 mg | Freq: Once | INTRAMUSCULAR | Status: AC
  Administered 2020-07-11: 150 mg via INTRAMUSCULAR

## 2020-07-11 NOTE — Progress Notes (Signed)
   NURSE VISIT- INJECTION  SUBJECTIVE:  Brenda Carlson is a 24 y.o. [phone] female here for a Depo Provera for contraception/period management. She is a GYN patient.   OBJECTIVE:  There were no vitals taken for this visit.  Appears well, in no apparent distress  Injection administered in: Left deltoid  No orders of the defined types were placed in this encounter.   ASSESSMENT: GYN patient Depo Provera for contraception/period management PLAN: Follow-up: in 11-13 weeks for next Depo   Brenda Carlson  07/11/2020 10:06 AM

## 2020-09-21 IMAGING — US US PELVIS COMPLETE TRANSABD/TRANSVAG W DUPLEX
2 of 3 series · 13 of 25 positions shown · non-contrast
Comparison: CT abdomen and pelvis performed on 11/11/2019.

CLINICAL DATA: Pelvic pain. Recent CT describing abnormalities
within the pelvis raising the possibility of ruptured hemorrhagic
ovarian cyst and PID.

EXAM:
TRANSABDOMINAL AND TRANSVAGINAL ULTRASOUND OF PELVIS
DOPPLER ULTRASOUND OF OVARIES
TECHNIQUE: Both transabdominal and transvaginal ultrasound examinations of the
pelvis were performed. Transabdominal technique was performed for
global imaging of the pelvis including uterus, ovaries, adnexal
regions, and pelvic cul-de-sac.
It was necessary to proceed with endovaginal exam following the
transabdominal exam to visualize the endometrial complex and adnexal
regions adequately. Color and duplex Doppler ultrasound was utilized
to evaluate blood flow to the ovaries.

[Series 1: us pelvis complete transabd/transvag w duplex · 46 acquisitions, 12 frames shown (1 of 2)]
[im 1/46]
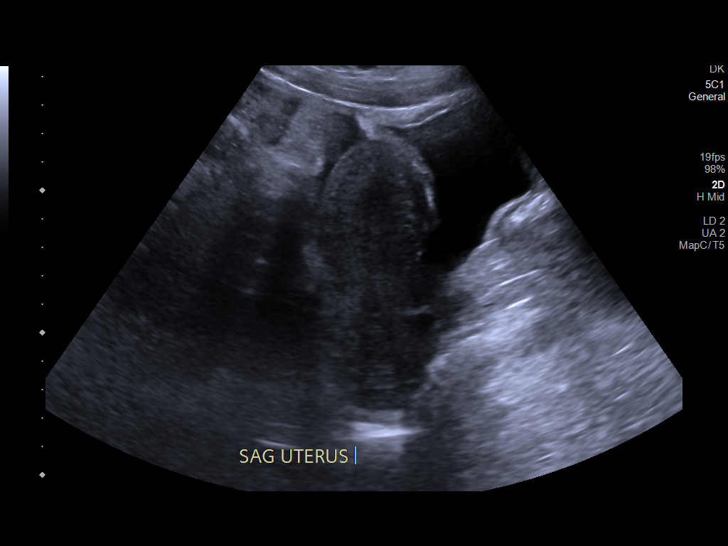
[im 5/46]
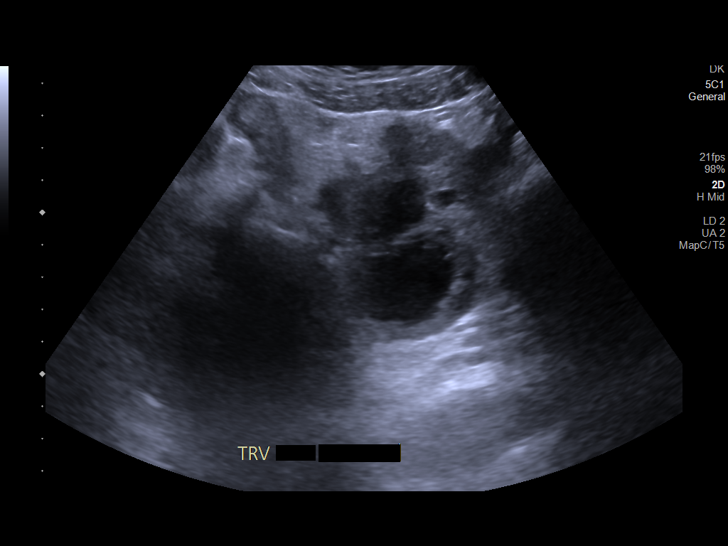
[im 9/46]
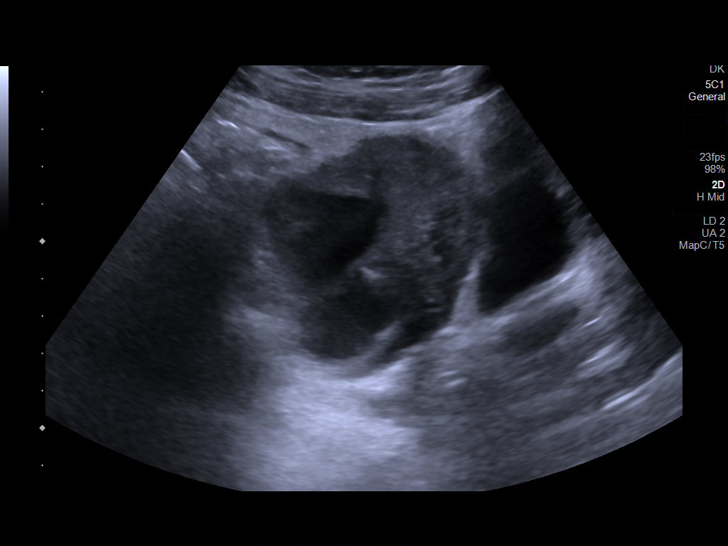
[im 13/46]
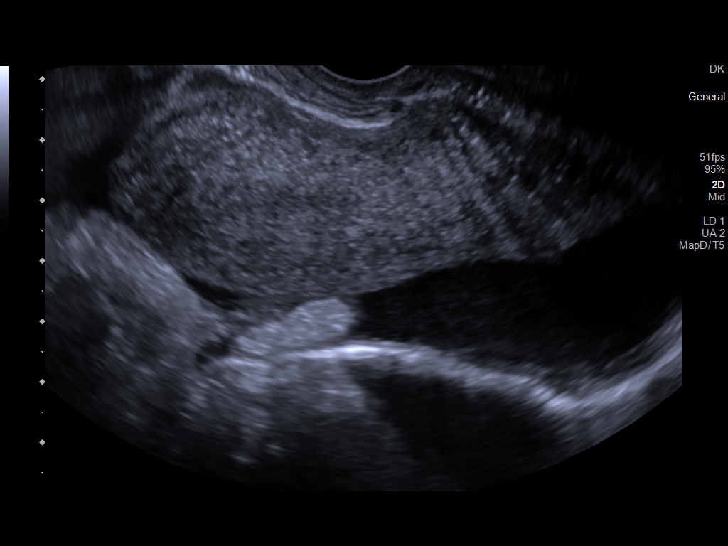
[im 17/46]
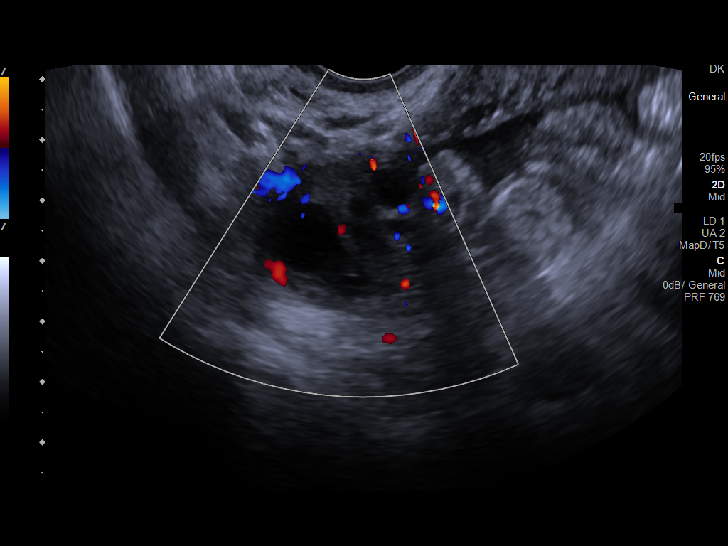
[im 21/46]
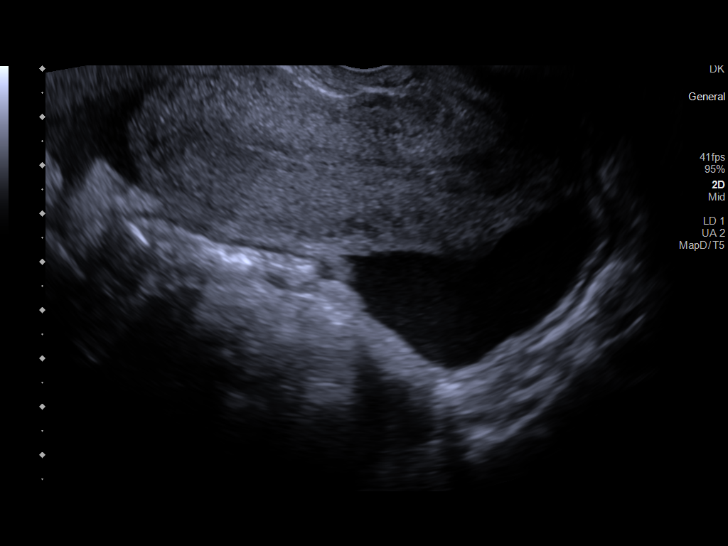
[im 25/46]
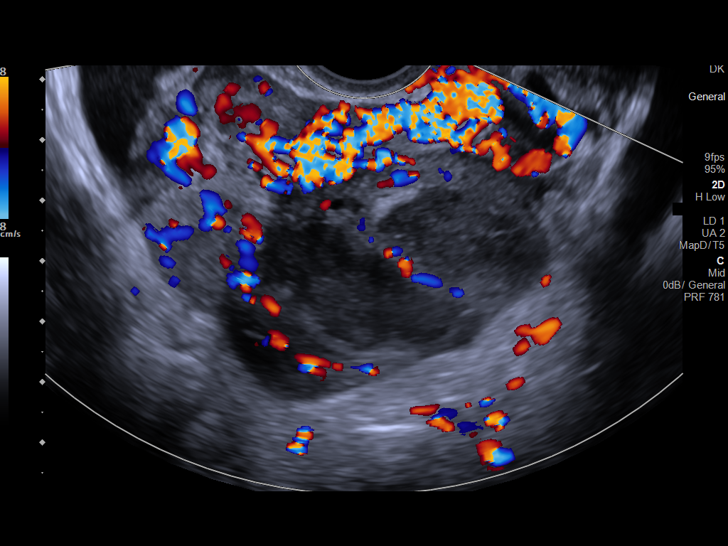
[im 29/46]
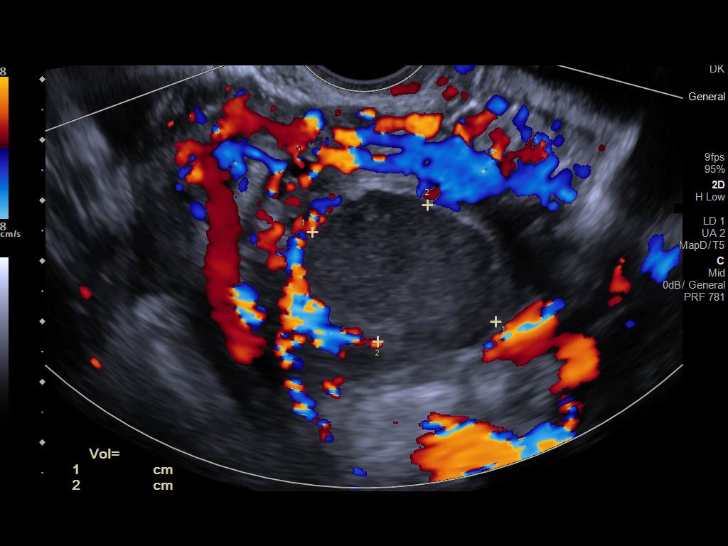
[im 33/46]
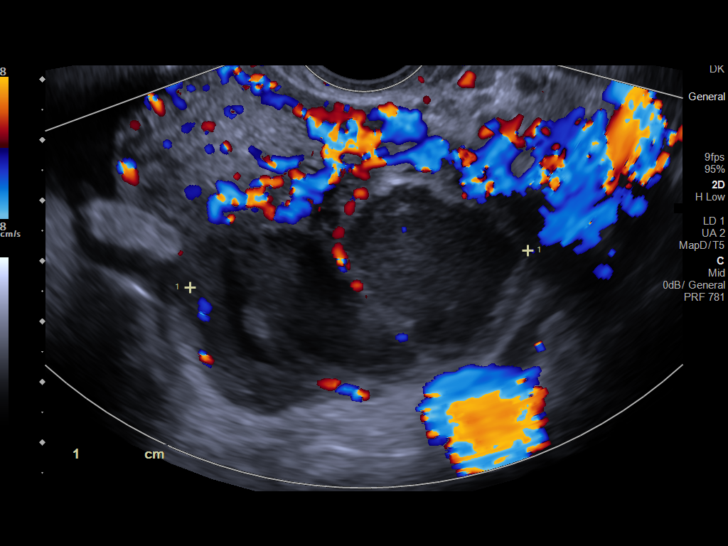
[im 37/46]
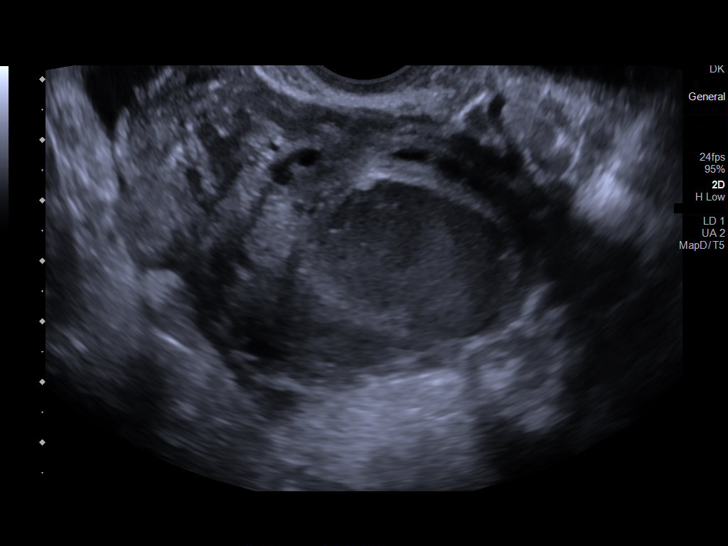
[im 41/46]
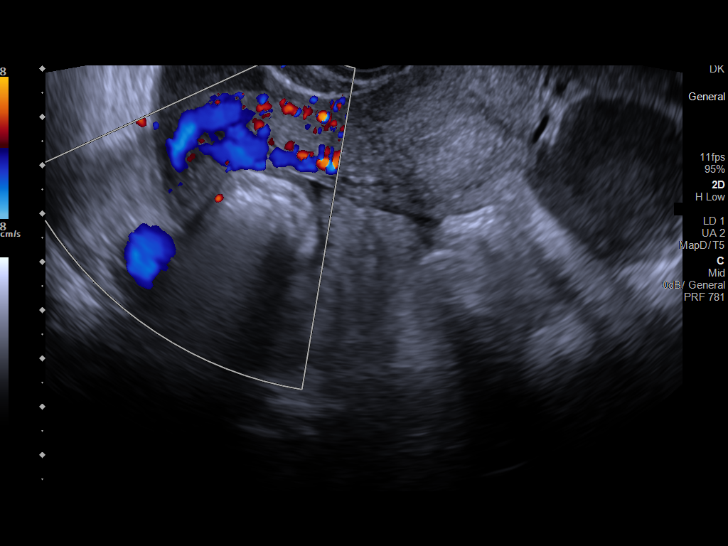
[im 46/46]
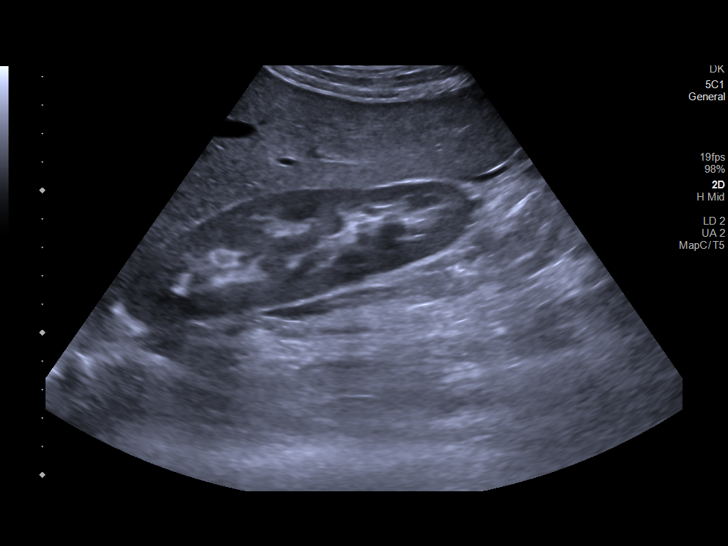

[Series 3: us pelvis complete transabd/transvag w duplex · 1 of 1 slices shown (2 of 2)]
[im 1/1]
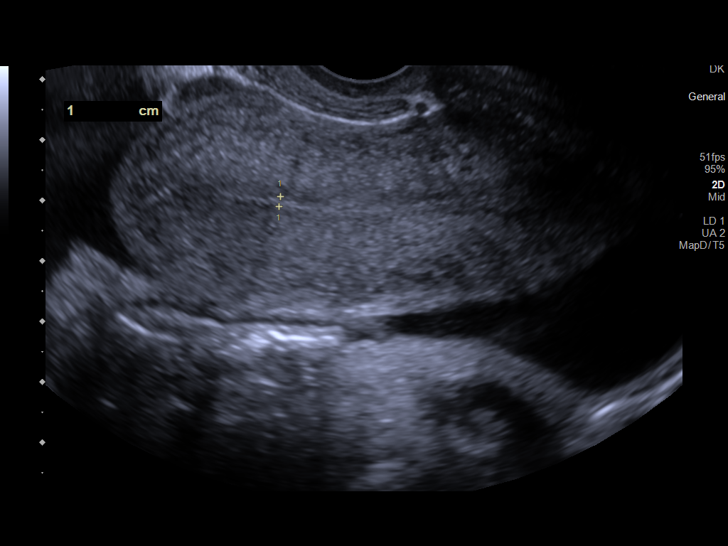

[13 of 25 positions shown; findings below may reference images not displayed]

FINDINGS: Uterus

Measurements: 9.1 x 3.7 x 4.8 cm = volume: 85 mL. No fibroids or
other mass visualized.

Endometrium

Thickness: 2 mm.  No focal abnormality visualized.

Right ovary

Measurements: 4.3 x 1.9 x 3 cm = volume: 13.1 mL. RIGHT ovary
appears normal. Moderate-to-large complex fluid collection within
the RIGHT adnexal region, blood products versus infectious.

Left ovary

Measurements: 7.7 x 4.9 x 6.2 cm = volume: 121 mL. This measurement
includes a complex cystic and solid mass which measures 5.6 x 2.9 x
5.6 cm, possibly septated/abutting ovarian hemorrhagic cysts,
possibly and abutting abscess or pyosalpinx given its configuration
on CT.

Pulsed Doppler evaluation of both ovaries demonstrates normal
low-resistance arterial and venous waveforms.

Other findings

As above.
IMPRESSION: 1. Complex cystic and solid mass in the LEFT adnexal region,
measuring 5.6 cm greatest dimension, contiguous with the LEFT ovary
so compatible with septated/abutting ovarian hemorrhagic cysts
versus abutting abscess or pyosalpinx given its configuration on CT.
In the absence of fever or white count, would favor ruptured ovarian
hemorrhagic cysts.
2. Moderate-to-large complex fluid collection within the RIGHT
adnexal region, blood products versus infectious. Again, in the
absence of fever, would favor ruptured hemorrhagic cyst.
3. No evidence of ovarian torsion.
4. Uterus appears normal.

## 2020-10-04 ENCOUNTER — Ambulatory Visit (INDEPENDENT_AMBULATORY_CARE_PROVIDER_SITE_OTHER): Payer: Medicaid Other | Admitting: *Deleted

## 2020-10-04 ENCOUNTER — Other Ambulatory Visit: Payer: Self-pay

## 2020-10-04 DIAGNOSIS — Z3042 Encounter for surveillance of injectable contraceptive: Secondary | ICD-10-CM

## 2020-10-04 MED ORDER — MEDROXYPROGESTERONE ACETATE 150 MG/ML IM SUSP
150.0000 mg | Freq: Once | INTRAMUSCULAR | Status: AC
  Administered 2020-10-04: 150 mg via INTRAMUSCULAR

## 2020-10-04 NOTE — Progress Notes (Signed)
   NURSE VISIT- INJECTION  SUBJECTIVE:  Brenda Carlson is a 25 y.o. [phone] female here for a Depo Provera for contraception/period management. She is a GYN patient.   OBJECTIVE:  There were no vitals taken for this visit.  Appears well, in no apparent distress  Injection administered in: Right deltoid  Meds ordered this encounter  Medications  . medroxyPROGESTERone (DEPO-PROVERA) injection 150 mg    ASSESSMENT: GYN patient Depo Provera for contraception/period management PLAN: Follow-up: in 11-13 weeks for next Depo   [name]  10/04/2020 9:58 AM

## 2020-12-19 ENCOUNTER — Other Ambulatory Visit: Payer: Self-pay | Admitting: Women's Health

## 2020-12-20 ENCOUNTER — Other Ambulatory Visit: Payer: Self-pay

## 2020-12-20 ENCOUNTER — Other Ambulatory Visit (HOSPITAL_COMMUNITY)
Admission: RE | Admit: 2020-12-20 | Discharge: 2020-12-20 | Disposition: A | Discharge status: Home / Self Care | Payer: Medicaid Other | Source: Ambulatory Visit | Attending: Adult Health | Admitting: Adult Health

## 2020-12-20 ENCOUNTER — Encounter: Payer: Self-pay | Admitting: Adult Health

## 2020-12-20 ENCOUNTER — Ambulatory Visit (INDEPENDENT_AMBULATORY_CARE_PROVIDER_SITE_OTHER): Payer: Medicaid Other | Admitting: Adult Health

## 2020-12-20 VITALS — BP 119/74 | HR 83 | Ht 66.0 in | Wt 149.0 lb

## 2020-12-20 DIAGNOSIS — Z0000 Encounter for general adult medical examination without abnormal findings: Secondary | ICD-10-CM | POA: Diagnosis not present

## 2020-12-20 DIAGNOSIS — R5383 Other fatigue: Secondary | ICD-10-CM | POA: Diagnosis not present

## 2020-12-20 DIAGNOSIS — Z01419 Encounter for gynecological examination (general) (routine) without abnormal findings: Secondary | ICD-10-CM

## 2020-12-20 DIAGNOSIS — Z8742 Personal history of other diseases of the female genital tract: Secondary | ICD-10-CM | POA: Insufficient documentation

## 2020-12-20 DIAGNOSIS — Z3042 Encounter for surveillance of injectable contraceptive: Secondary | ICD-10-CM

## 2020-12-20 MED ORDER — MEDROXYPROGESTERONE ACETATE 150 MG/ML IM SUSY: PREFILLED_SYRINGE | INTRAMUSCULAR | 4 refills | Status: DC

## 2020-12-20 MED ORDER — MEDROXYPROGESTERONE ACETATE 150 MG/ML IM SUSP
150.0000 mg | Freq: Once | INTRAMUSCULAR | Status: AC
  Administered 2020-12-20: 150 mg via INTRAMUSCULAR

## 2020-12-20 NOTE — Progress Notes (Signed)
Patient ID: Brenda Carlson, female   DOB: 04/10/1996, 25 y.o.   MRN: [id number] History of Present Illness:  Brenda Carlson is a 25 year old white female, single, G2P2, in for well woman gyn exam and pap. She is due her depo, will have to go get and come back for administration. She feels tired and irritable. Lab Results  Component Value Date   DIAGPAP (A) 03/01/2018    ATYPICAL SQUAMOUS CELLS OF UNDETERMINED SIGNIFICANCE (ASC-US).   HPV NOT DETECTED 03/01/2018    Current Medications, Allergies, Past Medical History, Past Surgical History, Family History and Social History were reviewed in [employer] record.     Review of Systems: Patient denies any headaches, hearing loss,  blurred vision, shortness of breath, chest pain, abdominal pain, problems with bowel movements, urination, or intercourse. No joint pain or mood swings. +tired  +irritable    Physical Exam:BP 119/74 (BP Location: Left Arm, Patient Position: Sitting, Cuff Size: Normal)   Pulse 83   Ht 5\' 6"  (1.676 m)   Wt 149 lb (67.6 kg)   BMI 24.05 kg/m  General:  Well developed, well nourished, no acute distress Skin:  Warm and dry Neck:  Midline trachea, normal thyroid, good ROM, no lymphadenopathy Lungs; Clear to auscultation bilaterally Breast:  No dominant palpable mass, retraction, or nipple discharge Cardiovascular: Regular rate and rhythm Abdomen:  Soft, non tender, no hepatosplenomegaly Pelvic:  External genitalia is normal in appearance, no lesions.  The vagina is normal in appearance,has mauve colored blood.discharge. Urethra has no lesions or masses. The cervix is bulbous.Pap with GC/CHL and HR HPV genotyping performed.  Uterus is felt to be normal size, shape, and contour.  No adnexal masses or tenderness noted.Bladder is non tender, no masses felt. Extremities/musculoskeletal:  No swelling or varicosities noted, no clubbing or cyanosis Psych:  No mood changes, alert and cooperative,seems happy AA  is 0 Fall risk is low Depression screen [facility] 2/9 12/20/2020 03/01/2018  Decreased Interest 0 0  Down, Depressed, Hopeless 0 0  PHQ - 2 Score 0 0  Altered sleeping 2 0  Tired, decreased energy 3 0  Change in appetite 3 0  Feeling bad or failure about yourself  0 0  Trouble concentrating 0 0  Moving slowly or fidgety/restless 0 0  Suicidal thoughts 0 0  PHQ-9 Score 8 0   GAD 7 : Generalized Anxiety Score 12/20/2020  Nervous, Anxious, on Edge 2  Control/stop worrying 0  Worry too much - different things 0  Trouble relaxing 2  Restless 0  Easily annoyed or irritable 3  Afraid - awful might happen 0  Total GAD 7 Score 7   She declines meds, not depressed just tired. Examination chaperoned by 12/22/2020 LPN  [facility] - 12/20/20 0949      Pregnancy Intention Screening   Does the patient want to become pregnant in the next year? No    Does the patient's partner want to become pregnant in the next year? No    Would the patient like to discuss contraceptive options today? No      Contraception Wrap Up   Current Method Hormonal Injection    End Method Hormonal Injection    Contraception Counseling Provided No           Impression and Plan: 1. Routine general medical examination at a health care facility Pap sent  - Cytology - PAP( [place])  2. Tired Will check labs  - CBC - TSH  3. History  of abnormal cervical Pap smear Pap sent   4. Encounter for surveillance of injectable contraceptive Refilled depo Meds ordered this encounter  Medications  . medroxyPROGESTERone Acetate 150 MG/ML SUSY    Sig: INJECT 1 ML INTO THE MUSCLE ONCE EVERY 3 MONTHS    Dispense:  1 mL    Refill:  4    Order Specific Question:   Supervising Provider    Answer:   Duane Lope H [[id number]]  She is going to get depo and com back   5. Encounter for gynecological examination with Papanicolaou smear of cervix Will check labs - CBC - Comprehensive metabolic panel - TSH - Lipid panel Pap  sent Physical in 1 year Pap in 3 if normal

## 2020-12-20 NOTE — Addendum Note (Signed)
Addended by: Colen Darling on: 12/20/2020 12:01 PM   Modules accepted: Orders

## 2020-12-21 LAB — CBC
Hematocrit: 42.3 % (ref 34.0–46.6)
Hemoglobin: 14.8 g/dL (ref 11.1–15.9)
MCH: 31.9 pg (ref 26.6–33.0)
MCHC: 35 g/dL (ref 31.5–35.7)
MCV: 91 fL (ref 79–97)
Platelets: 230 10*3/uL (ref 150–450)
RBC: 4.64 x10E6/uL (ref 3.77–5.28)
RDW: 12.3 % (ref 11.7–15.4)
WBC: 7.1 10*3/uL (ref 3.4–10.8)

## 2020-12-21 LAB — COMPREHENSIVE METABOLIC PANEL
ALT: 14 IU/L (ref 0–32)
AST: 15 IU/L (ref 0–40)
Albumin/Globulin Ratio: 2.8 — ABNORMAL HIGH (ref 1.2–2.2)
Albumin: 5 g/dL (ref 3.9–5.0)
Alkaline Phosphatase: 69 IU/L (ref 44–121)
BUN/Creatinine Ratio: 13 (ref 9–23)
BUN: 9 mg/dL (ref 6–20)
Bilirubin Total: 0.5 mg/dL (ref 0.0–1.2)
CO2: 22 mmol/L (ref 20–29)
Calcium: 9.9 mg/dL (ref 8.7–10.2)
Chloride: 103 mmol/L (ref 96–106)
Creatinine, Ser: 0.71 mg/dL (ref 0.57–1.00)
Globulin, Total: 1.8 g/dL (ref 1.5–4.5)
Glucose: 94 mg/dL (ref 65–99)
Potassium: 4.4 mmol/L (ref 3.5–5.2)
Sodium: 140 mmol/L (ref 134–144)
Total Protein: 6.8 g/dL (ref 6.0–8.5)
eGFR: 121 mL/min/{1.73_m2} (ref >59)

## 2020-12-21 LAB — LIPID PANEL
Chol/HDL Ratio: 5.4 ratio — ABNORMAL HIGH (ref 0.0–4.4)
Cholesterol, Total: 207 mg/dL — ABNORMAL HIGH (ref 100–199)
HDL: 38 mg/dL — ABNORMAL LOW (ref >39)
LDL Chol Calc (NIH): 148 mg/dL — ABNORMAL HIGH (ref 0–99)
Triglycerides: 117 mg/dL (ref 0–149)
VLDL Cholesterol Cal: 21 mg/dL (ref 5–40)

## 2020-12-21 LAB — TSH: TSH: 1.23 u[IU]/mL (ref 0.450–4.500)

## 2020-12-26 LAB — CYTOLOGY - PAP
Chlamydia: NEGATIVE
Comment: NEGATIVE
Comment: NEGATIVE
Comment: NORMAL
Diagnosis: NEGATIVE
High risk HPV: NEGATIVE
Neisseria Gonorrhea: NEGATIVE

## 2021-02-08 ENCOUNTER — Ambulatory Visit: Payer: Medicaid Other | Admitting: Internal Medicine

## 2021-03-11 ENCOUNTER — Ambulatory Visit: Payer: Medicaid Other

## 2021-03-13 ENCOUNTER — Ambulatory Visit: Payer: Medicaid Other | Admitting: Internal Medicine

## 2021-03-19 ENCOUNTER — Other Ambulatory Visit: Payer: Self-pay

## 2021-03-19 ENCOUNTER — Ambulatory Visit (INDEPENDENT_AMBULATORY_CARE_PROVIDER_SITE_OTHER): Payer: Medicaid Other

## 2021-03-19 DIAGNOSIS — Z3042 Encounter for surveillance of injectable contraceptive: Secondary | ICD-10-CM | POA: Diagnosis not present

## 2021-03-19 MED ORDER — MEDROXYPROGESTERONE ACETATE 150 MG/ML IM SUSY: PREFILLED_SYRINGE | Freq: Once | INTRAMUSCULAR | Status: AC

## 2021-03-19 NOTE — Progress Notes (Signed)
   NURSE VISIT- INJECTION  SUBJECTIVE:  Brenda Carlson is a 25 y.o. [phone] female here for a Depo Provera for contraception/period management. She is a GYN patient.   OBJECTIVE:  There were no vitals taken for this visit.  Appears well, in no apparent distress  Injection administered in: Right deltoid  Meds ordered this encounter  Medications   medroxyPROGESTERone Acetate SUSY    ASSESSMENT: GYN patient Depo Provera for contraception/period management PLAN: Follow-up: in 11-13 weeks for next Depo   Kalob Bergen A Gloriann Riede  03/19/2021 10:50 AM

## 2021-06-13 ENCOUNTER — Other Ambulatory Visit: Payer: Self-pay

## 2021-06-13 ENCOUNTER — Ambulatory Visit (INDEPENDENT_AMBULATORY_CARE_PROVIDER_SITE_OTHER): Payer: Medicaid Other | Admitting: *Deleted

## 2021-06-13 DIAGNOSIS — Z3042 Encounter for surveillance of injectable contraceptive: Secondary | ICD-10-CM

## 2021-06-13 MED ORDER — MEDROXYPROGESTERONE ACETATE 150 MG/ML IM SUSP
150.0000 mg | Freq: Once | INTRAMUSCULAR | Status: AC
  Administered 2021-06-13: 10:00:00 150 mg via INTRAMUSCULAR

## 2021-06-13 NOTE — Progress Notes (Signed)
   NURSE VISIT- INJECTION  SUBJECTIVE:  Brenda Carlson is a 25 y.o. [phone] female here for a Depo Provera for contraception/period management. She is a GYN patient.   OBJECTIVE:  There were no vitals taken for this visit.  Appears well, in no apparent distress  Injection administered in: Left deltoid  Meds ordered this encounter  Medications   medroxyPROGESTERone (DEPO-PROVERA) injection 150 mg    ASSESSMENT: GYN patient Depo Provera for contraception/period management PLAN: Follow-up: in 11-13 weeks for next Depo   Brenda Carlson  06/13/2021 10:32 AM

## 2021-06-22 ENCOUNTER — Encounter (HOSPITAL_COMMUNITY): Payer: Self-pay | Admitting: Emergency Medicine

## 2021-06-22 ENCOUNTER — Other Ambulatory Visit: Payer: Self-pay

## 2021-06-22 ENCOUNTER — Emergency Department (HOSPITAL_COMMUNITY)
Admission: EM | Admit: 2021-06-22 | Discharge: 2021-06-22 | Disposition: A | Discharge status: Home / Self Care | Payer: Medicaid Other | Attending: Emergency Medicine | Admitting: Emergency Medicine

## 2021-06-22 ENCOUNTER — Emergency Department (HOSPITAL_COMMUNITY): Payer: Medicaid Other

## 2021-06-22 DIAGNOSIS — R000 Tachycardia, unspecified: Secondary | ICD-10-CM | POA: Diagnosis not present

## 2021-06-22 DIAGNOSIS — Z20822 Contact with and (suspected) exposure to covid-19: Secondary | ICD-10-CM | POA: Diagnosis not present

## 2021-06-22 DIAGNOSIS — R509 Fever, unspecified: Secondary | ICD-10-CM | POA: Diagnosis present

## 2021-06-22 DIAGNOSIS — R918 Other nonspecific abnormal finding of lung field: Secondary | ICD-10-CM | POA: Diagnosis not present

## 2021-06-22 DIAGNOSIS — J189 Pneumonia, unspecified organism: Secondary | ICD-10-CM | POA: Insufficient documentation

## 2021-06-22 DIAGNOSIS — J40 Bronchitis, not specified as acute or chronic: Secondary | ICD-10-CM | POA: Diagnosis not present

## 2021-06-22 LAB — RESP PANEL BY RT-PCR (FLU A&B, COVID) ARPGX2
Influenza A by PCR: NEGATIVE
Influenza B by PCR: NEGATIVE
SARS Coronavirus 2 by RT PCR: NEGATIVE

## 2021-06-22 MED ORDER — AMOXICILLIN-POT CLAVULANATE 875-125 MG PO TABS
1.0000 | ORAL_TABLET | Freq: Once | ORAL | Status: AC
  Administered 2021-06-22: 1 via ORAL
  Filled 2021-06-22: qty 1

## 2021-06-22 MED ORDER — ALBUTEROL SULFATE HFA 108 (90 BASE) MCG/ACT IN AERS
2.0000 | INHALATION_SPRAY | Freq: Once | RESPIRATORY_TRACT | Status: AC
  Administered 2021-06-22: 2 via RESPIRATORY_TRACT
  Filled 2021-06-22: qty 6.7

## 2021-06-22 MED ORDER — KETOROLAC TROMETHAMINE 60 MG/2ML IM SOLN
30.0000 mg | Freq: Once | INTRAMUSCULAR | Status: AC
  Administered 2021-06-22: 30 mg via INTRAMUSCULAR
  Filled 2021-06-22: qty 2

## 2021-06-22 MED ORDER — AMOXICILLIN-POT CLAVULANATE 875-125 MG PO TABS: 1.0000 | ORAL_TABLET | Freq: Two times a day (BID) | ORAL | 0 refills | Status: DC

## 2021-06-22 MED ORDER — HYDROCODONE BIT-HOMATROP MBR 5-1.5 MG/5ML PO SOLN: 5.0000 mL | Freq: Four times a day (QID) | ORAL | 0 refills | Status: DC | PRN

## 2021-06-22 MED ORDER — HYDROCODONE BIT-HOMATROP MBR 5-1.5 MG/5ML PO SOLN
5.0000 mL | Freq: Once | ORAL | Status: AC
  Administered 2021-06-22: 5 mL via ORAL
  Filled 2021-06-22: qty 5

## 2021-06-22 MED ORDER — ONDANSETRON 8 MG PO TBDP
8.0000 mg | ORAL_TABLET | Freq: Once | ORAL | Status: AC
  Administered 2021-06-22: 8 mg via ORAL
  Filled 2021-06-22: qty 1

## 2021-06-22 MED ORDER — ACETAMINOPHEN 500 MG PO TABS
1000.0000 mg | ORAL_TABLET | Freq: Once | ORAL | Status: AC
  Administered 2021-06-22: 1000 mg via ORAL
  Filled 2021-06-22: qty 2

## 2021-06-22 NOTE — ED Notes (Signed)
Pt denies vomiting from nausea, says she coughs so much that she vomits- pt given 2 cups of water for PO hydration- Dr Clayborne Dana aware.

## 2021-06-22 NOTE — ED Triage Notes (Signed)
Pt with fevers and cough. States chest hurts from coughing. Also c/o body aches and chills.

## 2021-06-23 NOTE — ED Provider Notes (Signed)
[facility] EMERGENCY DEPARTMENT Provider Note   CSN: [id number] Arrival date & time: 06/22/21  0309     History No chief complaint on file.   Brenda Carlson is a 25 y.o. female.   Cough Cough characteristics:  Non-productive Sputum characteristics:  Unable to specify Severity:  Mild Onset quality:  Gradual Duration:  4 days Timing:  Constant Progression:  Worsening Chronicity:  New Relieved by:  None tried Ineffective treatments:  None tried Associated symptoms: chills, fever, headaches, myalgias and sore throat   Associated symptoms: no chest pain       Past Medical History:  Diagnosis Date   Kidney stones    kidney stones   Medical history non-contributory    Pregnancy    Vaginal Pap smear, abnormal     Patient Active Problem List   Diagnosis Date Noted   Tired 12/20/2020   Routine general medical examination at a health care facility 12/20/2020   Encounter for surveillance of injectable contraceptive 12/20/2020   History of abnormal cervical Pap smear 12/20/2020   Encounter for gynecological examination with Papanicolaou smear of cervix 12/20/2020   Chlamydia 01/29/2020   Trichomonas infection 01/29/2020   Abnormal Pap smear of cervix 03/03/2018   Marijuana use 03/02/2018    Past Surgical History:  Procedure Laterality Date   NO PAST SURGERIES       OB History     Gravida  2   Para  2   Term  2   Preterm      AB      Living  2      SAB      IAB      Ectopic      Multiple  0   Live Births  2           Family History  Problem Relation Age of Onset   Varicose Veins Mother    Diabetes Mother    Cystic fibrosis Sister    Diabetes Paternal Grandmother    Diabetes Paternal Grandfather     Social History   Tobacco Use   Smoking status: Never   Smokeless tobacco: Never  Vaping Use   Vaping Use: Never used  Substance Use Topics   Alcohol use: Not Currently   Drug use: Never    Home Medications Prior to Admission  medications   Medication Sig Start Date End Date Taking? Authorizing Provider  amoxicillin-clavulanate (AUGMENTIN) 875-125 MG tablet Take 1 tablet by mouth 2 (two) times daily. One po bid x 7 days 06/22/21  Yes Tyrick Dunagan, Barbara Cower, MD  HYDROcodone bit-homatropine (HYCODAN) 5-1.5 MG/5ML syrup Take 5 mLs by mouth every 6 (six) hours as needed for cough. 06/22/21  Yes Mora Pedraza, Barbara Cower, MD  medroxyPROGESTERone Acetate 150 MG/ML SUSY INJECT 1 ML INTO THE MUSCLE ONCE EVERY 3 MONTHS 12/20/20   Adline Potter, NP    Allergies    Patient has no known allergies.  Review of Systems   Review of Systems  Constitutional:  Positive for chills and fever.  HENT:  Positive for sore throat.   Respiratory:  Positive for cough.   Cardiovascular:  Negative for chest pain.  Musculoskeletal:  Positive for myalgias.  Neurological:  Positive for headaches.  All other systems reviewed and are negative.  Physical Exam Updated Vital Signs BP 122/66   Pulse 93   Temp 98.5 F (36.9 C) (Oral)   Resp 18   Ht 5\' 6"  (1.676 m)   Wt 72.6 kg   SpO2 96%  BMI 25.82 kg/m   Physical Exam Vitals and nursing note reviewed.  Constitutional:      Appearance: She is well-developed.  HENT:     Head: Normocephalic and atraumatic.     Mouth/Throat:     Mouth: Mucous membranes are moist.     Pharynx: Oropharynx is clear.  Eyes:     Pupils: Pupils are equal, round, and reactive to light.  Cardiovascular:     Rate and Rhythm: Regular rhythm. Tachycardia present.  Pulmonary:     Effort: No respiratory distress.     Breath sounds: No stridor. Wheezing present.  Abdominal:     General: Abdomen is flat. There is no distension.  Musculoskeletal:     Cervical back: Normal range of motion.  Skin:    General: Skin is warm and dry.  Neurological:     General: No focal deficit present.     Mental Status: She is alert.    ED Results / Procedures / Treatments   Labs (all labs ordered are listed, but only abnormal results  are displayed) Labs Reviewed  RESP PANEL BY RT-PCR (FLU A&B, COVID) [id number]    EKG None  Radiology DG Chest Portable 1 View  Result Date: 06/22/2021 CLINICAL DATA:  Initial evaluation for acute fever, cough, chest pain. EXAM: PORTABLE CHEST 1 VIEW COMPARISON:  Prior radiograph from 08/07/2019. FINDINGS: Cardiac and mediastinal silhouettes within normal limits. Lungs well inflated. Mild diffuse bronchitic changes noted. No focal infiltrates or consolidative airspace disease. No pulmonary edema or pleural effusion. No pneumothorax. No acute osseous finding. IMPRESSION: Subtle diffuse peribronchial thickening, suggesting possible acute bronchiolitis given provided history of cough and fever. No focal infiltrates to suggest bronchopneumonia. Electronically Signed   By: Rise Mu M.D.   On: 06/22/2021 05:36    Procedures Procedures   Medications Ordered in ED Medications  ondansetron (ZOFRAN-ODT) disintegrating tablet 8 mg (8 mg Oral Given 06/22/21 0409)  HYDROcodone bit-homatropine (HYCODAN) 5-1.5 MG/5ML syrup 5 mL (5 mLs Oral Given 06/22/21 0508)  ketorolac (TORADOL) injection 30 mg (30 mg Intramuscular Given 06/22/21 0508)  acetaminophen (TYLENOL) tablet 1,000 mg (1,000 mg Oral Given 06/22/21 0508)  amoxicillin-clavulanate (AUGMENTIN) 875-125 MG per tablet 1 tablet (1 tablet Oral Given 06/22/21 0548)  albuterol (VENTOLIN HFA) 108 (90 Base) MCG/ACT inhaler 2 puff (2 puffs Inhalation Given 06/22/21 0551)    ED Course  I have reviewed the triage vital signs and the nursing notes.  Pertinent labs & imaging results that were available during my care of the patient were reviewed by me and considered in my medical decision making (see chart for details).    MDM Rules/Calculators/A&P                         Overal appears to have viral type symptoms but has been going on for quite awhile. Viral tests negative. Will treat for bacterial causes. Tolerating pO. Cough improved with  meds.   Final Clinical Impression(s) / ED Diagnoses Final diagnoses:  Community acquired pneumonia, unspecified laterality    Rx / DC Orders ED Discharge Orders          Ordered    amoxicillin-clavulanate (AUGMENTIN) 875-125 MG tablet  2 times daily        06/22/21 0607    HYDROcodone bit-homatropine (HYCODAN) 5-1.5 MG/5ML syrup  Every 6 hours PRN        06/22/21 1937             Levin Dagostino, Barbara Cower,  MD 06/23/21 0302

## 2021-06-26 ENCOUNTER — Telehealth: Payer: Self-pay | Admitting: Adult Health

## 2021-06-26 NOTE — Telephone Encounter (Signed)
Patient has been on depo for over a year. She's been bleeding for 3 days super heavy.She wants to know if this is normal. She did go to hospital over the weekend and they put her on amoxicillin for pneumonia.

## 2021-06-26 NOTE — Telephone Encounter (Signed)
Pt is on Amoxicillin for pneumonia. She is on Depo and started bleeding heavy 3 days ago. Bleeding started when she started Amoxicillin. I spoke with JAG. She don't recommend anything different at this time. Keep taking Amoxicillin. Pt voiced understanding. JSY

## 2021-09-06 ENCOUNTER — Other Ambulatory Visit: Payer: Self-pay

## 2021-09-06 ENCOUNTER — Ambulatory Visit (INDEPENDENT_AMBULATORY_CARE_PROVIDER_SITE_OTHER): Payer: Medicaid Other | Admitting: *Deleted

## 2021-09-06 DIAGNOSIS — Z3042 Encounter for surveillance of injectable contraceptive: Secondary | ICD-10-CM | POA: Diagnosis not present

## 2021-09-06 MED ORDER — MEDROXYPROGESTERONE ACETATE 150 MG/ML IM SUSP
150.0000 mg | Freq: Once | INTRAMUSCULAR | Status: AC
  Administered 2021-09-06: 150 mg via INTRAMUSCULAR

## 2021-09-06 NOTE — Progress Notes (Signed)
° °  NURSE VISIT- INJECTION  SUBJECTIVE:  Brenda Carlson is a 26 y.o. [phone] female here for a Depo Provera for contraception/period management. She is a GYN patient.   OBJECTIVE:  There were no vitals taken for this visit.  Appears well, in no apparent distress  Injection administered in: Right deltoid  Meds ordered this encounter  Medications   medroxyPROGESTERone (DEPO-PROVERA) injection 150 mg    ASSESSMENT: GYN patient Depo Provera for contraception/period management PLAN: Follow-up: in 11-13 weeks for next Depo   Jobe Marker  09/06/2021 10:20 AM

## 2021-11-29 ENCOUNTER — Ambulatory Visit: Payer: Medicaid Other

## 2021-12-24 ENCOUNTER — Other Ambulatory Visit: Payer: Medicaid Other | Admitting: Adult Health

## 2022-02-12 ENCOUNTER — Encounter (HOSPITAL_COMMUNITY): Payer: Self-pay

## 2022-02-12 ENCOUNTER — Other Ambulatory Visit: Payer: Self-pay

## 2022-02-12 ENCOUNTER — Emergency Department (HOSPITAL_COMMUNITY): Payer: Medicaid Other

## 2022-02-12 ENCOUNTER — Emergency Department (HOSPITAL_COMMUNITY)
Admission: EM | Admit: 2022-02-12 | Discharge: 2022-02-12 | Disposition: A | Discharge status: Home / Self Care | Payer: Medicaid Other | Attending: Emergency Medicine | Admitting: Emergency Medicine

## 2022-02-12 DIAGNOSIS — R519 Headache, unspecified: Secondary | ICD-10-CM | POA: Diagnosis not present

## 2022-02-12 DIAGNOSIS — Y92410 Unspecified street and highway as the place of occurrence of the external cause: Secondary | ICD-10-CM | POA: Diagnosis not present

## 2022-02-12 DIAGNOSIS — G44309 Post-traumatic headache, unspecified, not intractable: Secondary | ICD-10-CM | POA: Diagnosis not present

## 2022-02-12 DIAGNOSIS — S0990XA Unspecified injury of head, initial encounter: Secondary | ICD-10-CM | POA: Diagnosis present

## 2022-02-12 DIAGNOSIS — S060X0A Concussion without loss of consciousness, initial encounter: Secondary | ICD-10-CM | POA: Diagnosis not present

## 2022-02-12 MED ORDER — ONDANSETRON 8 MG PO TBDP: 8.0000 mg | ORAL_TABLET | Freq: Three times a day (TID) | ORAL | 0 refills | Status: DC | PRN

## 2022-02-12 MED ORDER — ACETAMINOPHEN 325 MG PO TABS
650.0000 mg | ORAL_TABLET | Freq: Once | ORAL | Status: AC
  Administered 2022-02-12: 650 mg via ORAL
  Filled 2022-02-12: qty 2

## 2022-02-12 MED ORDER — ONDANSETRON 8 MG PO TBDP
8.0000 mg | ORAL_TABLET | Freq: Once | ORAL | Status: AC
Start: 2022-02-12 — End: 2022-02-12
  Administered 2022-02-12: 8 mg via ORAL
  Filled 2022-02-12: qty 1
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
## 2022-02-12 NOTE — Discharge Instructions (Signed)
Take over-the-counter medications as needed for aches and pains.  The Zofran can help with any nausea.  Symptoms should slowly improve over the next several days.

## 2022-02-17 ENCOUNTER — Ambulatory Visit
Admission: EM | Admit: 2022-02-17 | Discharge: 2022-02-17 | Disposition: A | Discharge status: Home / Self Care | Payer: Medicaid Other | Attending: Family Medicine | Admitting: Family Medicine

## 2022-02-17 DIAGNOSIS — R110 Nausea: Secondary | ICD-10-CM

## 2022-02-17 DIAGNOSIS — G44311 Acute post-traumatic headache, intractable: Secondary | ICD-10-CM

## 2022-02-17 DIAGNOSIS — G44309 Post-traumatic headache, unspecified, not intractable: Secondary | ICD-10-CM

## 2022-02-17 MED ORDER — PROMETHAZINE HCL 25 MG PO TABS: 25.0000 mg | ORAL_TABLET | Freq: Four times a day (QID) | ORAL | 0 refills | Status: DC | PRN
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
## 2022-03-06 ENCOUNTER — Ambulatory Visit (INDEPENDENT_AMBULATORY_CARE_PROVIDER_SITE_OTHER): Payer: Medicaid Other | Admitting: *Deleted

## 2022-03-06 DIAGNOSIS — N926 Irregular menstruation, unspecified: Secondary | ICD-10-CM | POA: Diagnosis not present

## 2022-03-06 DIAGNOSIS — N898 Other specified noninflammatory disorders of vagina: Secondary | ICD-10-CM | POA: Diagnosis not present

## 2022-03-06 DIAGNOSIS — Z3202 Encounter for pregnancy test, result negative: Secondary | ICD-10-CM | POA: Diagnosis not present

## 2022-03-06 DIAGNOSIS — Z113 Encounter for screening for infections with a predominantly sexual mode of transmission: Secondary | ICD-10-CM | POA: Diagnosis not present

## 2022-03-06 LAB — POCT URINE PREGNANCY: Preg Test, Ur: NEGATIVE

## 2022-03-06 NOTE — Progress Notes (Signed)
   NURSE VISIT- PREGNANCY CONFIRMATION   SUBJECTIVE:  Brenda Carlson is a 26 y.o. [phone] female at Unknown by certain LMP of Patient's last menstrual period was 02/10/2022. Her last period 6/30 was 5 days and very heavy but had 3 days of spotting 7/17-7/19.  She went to [facility] to visit some friends and had intercourse once while there around 7/15. Has not taken a home pregnancy test as she "was scared" She reports nausea.  She is not taking prenatal vitamins.  Requesting STD screening as well as she is having a thin discharge.  OBJECTIVE:  LMP 02/10/2022   Appears well, in no apparent distress  Results for orders placed or performed in visit on 03/06/22 (from the past 24 hour(s))  POCT urine pregnancy   Collection Time: 03/06/22  3:34 PM  Result Value Ref Range   Preg Test, Ur Negative Negative    ASSESSMENT: Negative pregnancy test    PLAN: Schedule for dating ultrasound in TBD  Prenatal vitamins:  will begin if she is pregnant    Nausea medicines: not currently needed   OB packet given: No  Jobe Marker  03/06/2022 3:38 PM

## 2022-03-07 LAB — BETA HCG QUANT (REF LAB): hCG Quant: <1 m[IU]/mL

## 2022-03-08 LAB — GC/CHLAMYDIA PROBE AMP
Chlamydia trachomatis, NAA: NEGATIVE
Neisseria Gonorrhoeae by PCR: NEGATIVE

## 2022-03-10 LAB — TRICHOMONAS VAGINALIS, PROBE AMP: Trich vag by NAA: NEGATIVE

## 2022-05-02 IMAGING — DX DG CHEST 1V PORT
1 series · 1 of 1 positions shown · non-contrast
Comparison: Prior radiograph from 08/07/2019.

CLINICAL DATA: Initial evaluation for acute fever, cough, chest
pain.

EXAM:
PORTABLE CHEST 1 VIEW

[chest ap]
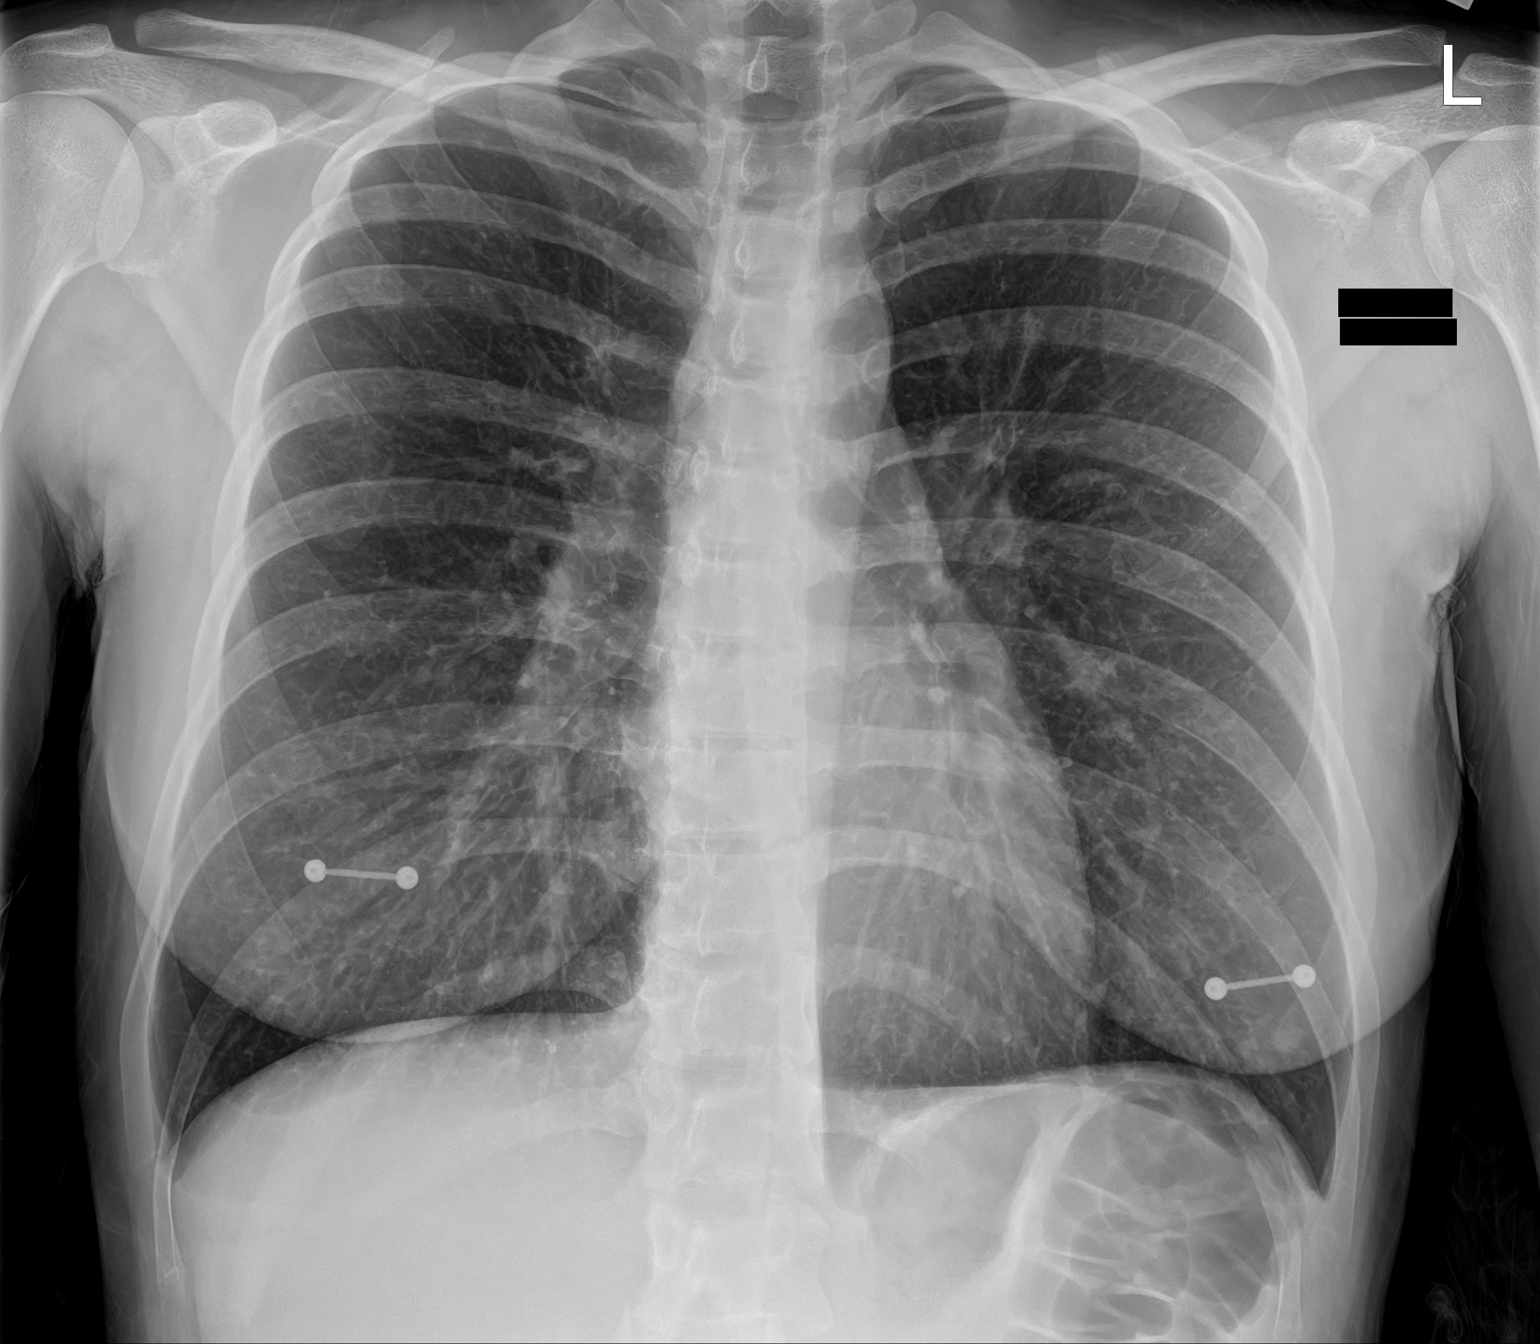

[1 of 1 positions shown; findings below may reference images not displayed]

FINDINGS: Cardiac and mediastinal silhouettes within normal limits.

Lungs well inflated. Mild diffuse bronchitic changes noted. No focal
infiltrates or consolidative airspace disease. No pulmonary edema or
pleural effusion. No pneumothorax.

No acute osseous finding.
IMPRESSION: Subtle diffuse peribronchial thickening, suggesting possible acute
bronchiolitis given provided history of cough and fever. No focal
infiltrates to suggest bronchopneumonia.

## 2022-05-22 ENCOUNTER — Other Ambulatory Visit (HOSPITAL_COMMUNITY)
Admission: RE | Admit: 2022-05-22 | Discharge: 2022-05-22 | Disposition: A | Discharge status: Home / Self Care | Payer: Medicaid Other | Source: Ambulatory Visit | Attending: Adult Health | Admitting: Adult Health

## 2022-05-22 ENCOUNTER — Ambulatory Visit (INDEPENDENT_AMBULATORY_CARE_PROVIDER_SITE_OTHER): Payer: Medicaid Other | Admitting: Adult Health

## 2022-05-22 ENCOUNTER — Encounter: Payer: Self-pay | Admitting: Adult Health

## 2022-05-22 VITALS — BP 118/79 | HR 81 | Ht 67.0 in | Wt 147.5 lb

## 2022-05-22 DIAGNOSIS — Z113 Encounter for screening for infections with a predominantly sexual mode of transmission: Secondary | ICD-10-CM | POA: Insufficient documentation

## 2022-05-22 DIAGNOSIS — Z0000 Encounter for general adult medical examination without abnormal findings: Secondary | ICD-10-CM

## 2022-05-22 DIAGNOSIS — N898 Other specified noninflammatory disorders of vagina: Secondary | ICD-10-CM | POA: Insufficient documentation

## 2022-05-22 DIAGNOSIS — Z3202 Encounter for pregnancy test, result negative: Secondary | ICD-10-CM | POA: Diagnosis not present

## 2022-05-22 DIAGNOSIS — Z3009 Encounter for other general counseling and advice on contraception: Secondary | ICD-10-CM | POA: Diagnosis not present

## 2022-05-22 DIAGNOSIS — Z01419 Encounter for gynecological examination (general) (routine) without abnormal findings: Secondary | ICD-10-CM

## 2022-05-22 LAB — POCT URINE PREGNANCY: Preg Test, Ur: NEGATIVE

## 2022-05-22 NOTE — Progress Notes (Signed)
Patient ID: Brenda Carlson, female   DOB: 09-21-1995, 26 y.o.   MRN: [id number] History of Present Illness: Brenda Carlson is a 26 year old white female,single, G2P2002 in for a well woman gyn exam and talk birth control.Will lose medicaid end of this year, she says.  Last pap was  negative HPV and malignancy 12/20/21.    Current Medications, Allergies, Past Medical History, Past Surgical History, Family History and Social History were reviewed in [employer] record.     Review of Systems: Patient denies any headaches, hearing loss, fatigue, blurred vision, shortness of breath, chest pain, abdominal pain, problems with bowel movements, urination, or intercourse. No joint pain or mood swings.  Periods not regular since stopping depo in May, but felt angry on depo Not sleeping well, but has not taken anything and does not want to   Physical Exam:BP 118/79 (BP Location: Left Arm, Patient Position: Sitting, Cuff Size: Normal)   Pulse 81   Ht 5\' 7"  (1.702 m)   Wt 147 lb 8 oz (66.9 kg)   LMP 04/30/2022 (Approximate)   BMI 23.10 kg/m  UPT is negative General:  Well developed, well nourished, no acute distress Skin:  Warm and dry Neck:  Midline trachea, normal thyroid, good ROM, no lymphadenopathy Lungs; Clear to auscultation bilaterally Breast:  No dominant palpable mass, retraction, or nipple discharge,has bilateral nipple rods  Cardiovascular: Regular rate and rhythm Abdomen:  Soft, non tender, no hepatosplenomegaly Pelvic:  External genitalia is normal in appearance, no lesions.  The vagina is normal in appearance, has frothy white discharge with slight odor, CV swab obtained. Urethra has no lesions or masses. The cervix is bulbous.  Uterus is felt to be normal size, shape, and contour.  No adnexal masses or tenderness noted.Bladder is non tender, no masses felt. Extremities/musculoskeletal:  No swelling or varicosities noted, no clubbing or cyanosis Psych:  No mood changes,  alert and cooperative,seems happy AA is 1 Fall risk is low    05/22/2022   10:32 AM 12/20/2020    9:50 AM 03/01/2018    9:07 AM  Depression screen PHQ 2/9  Decreased Interest 0 0 0  Down, Depressed, Hopeless 0 0 0  PHQ - 2 Score 0 0 0  Altered sleeping 3 2 0  Tired, decreased energy 2 3 0  Change in appetite 2 3 0  Feeling bad or failure about yourself  0 0 0  Trouble concentrating 0 0 0  Moving slowly or fidgety/restless 0 0 0  Suicidal thoughts 0 0 0  PHQ-9 Score 7 8 0       05/22/2022   10:32 AM 12/20/2020    9:50 AM  GAD 7 : Generalized Anxiety Score  Nervous, Anxious, on Edge 3 2  Control/stop worrying 1 0  Worry too much - different things 3 0  Trouble relaxing 2 2  Restless 3 0  Easily annoyed or irritable 3 3  Afraid - awful might happen 0 0  Total GAD 7 Score 15 7      Upstream - 05/22/22 1047       Pregnancy Intention Screening   Does the patient want to become pregnant in the next year? No    Does the patient's partner want to become pregnant in the next year? No    Would the patient like to discuss contraceptive options today? Yes      Contraception Wrap Up   Current Method No Method - Other Reason    End Method  Abstinence   will get nexplanon 06/03/22   Contraception Counseling Provided Yes             Examination chaperoned by Levy Pupa LPN  Impression and Plan: 1. Pregnancy examination or test, negative result - POCT urine pregnancy  2. Encounter for well woman exam with routine gynecological exam Physical in 1 year  Pap in 2025 Discussed trying to get Family Planning Medicaid   3. General counseling and advice for contraceptive management She wants nexplanon, had sex 2 days ago No sex and return 06/03/22 for nexplanon insertion Handout given   4. Screening for STD (sexually transmitted disease) CV swab sent for GC/CHL,trich,BV and yeast  - Cervicovaginal ancillary only( [place])  5. Vaginal discharge CV swab sent for  GC/CHL,trich,BV and yeast  - Cervicovaginal ancillary only( [place])

## 2022-05-23 LAB — CERVICOVAGINAL ANCILLARY ONLY
Bacterial Vaginitis (gardnerella): POSITIVE — AB
Candida Glabrata: NEGATIVE
Candida Vaginitis: NEGATIVE
Chlamydia: NEGATIVE
Comment: NEGATIVE
Comment: NEGATIVE
Comment: NEGATIVE
Comment: NEGATIVE
Comment: NEGATIVE
Comment: NORMAL
Neisseria Gonorrhea: NEGATIVE
Trichomonas: NEGATIVE

## 2022-05-26 ENCOUNTER — Other Ambulatory Visit: Payer: Self-pay | Admitting: Adult Health

## 2022-05-26 MED ORDER — METRONIDAZOLE 500 MG PO TABS: 500.0000 mg | ORAL_TABLET | Freq: Two times a day (BID) | ORAL | 0 refills | Status: DC

## 2022-05-26 NOTE — Progress Notes (Signed)
+  BV on vaginal swab, will rx flagyl, no sex or alcohol while taking  

## 2022-06-03 ENCOUNTER — Ambulatory Visit (INDEPENDENT_AMBULATORY_CARE_PROVIDER_SITE_OTHER): Payer: Medicaid Other | Admitting: Adult Health

## 2022-06-03 ENCOUNTER — Encounter: Payer: Self-pay | Admitting: Adult Health

## 2022-06-03 VITALS — BP 112/67 | HR 62 | Ht 67.0 in | Wt 152.5 lb

## 2022-06-03 DIAGNOSIS — Z3202 Encounter for pregnancy test, result negative: Secondary | ICD-10-CM

## 2022-06-03 DIAGNOSIS — Z30017 Encounter for initial prescription of implantable subdermal contraceptive: Secondary | ICD-10-CM | POA: Diagnosis not present

## 2022-06-03 LAB — POCT URINE PREGNANCY: Preg Test, Ur: NEGATIVE

## 2022-06-03 MED ORDER — ETONOGESTREL 68 MG ~~LOC~~ IMPL
68.0000 mg | DRUG_IMPLANT | Freq: Once | SUBCUTANEOUS | Status: AC
  Administered 2022-06-03: 68 mg via SUBCUTANEOUS

## 2022-06-03 NOTE — Patient Instructions (Signed)
Use condoms x 2 weeks, keep clean and dry x 24 hours, no heavy lifting, keep steri strips on x 72 hours, Keep pressure dressing on x 24 hours. Follow up prn problems.  

## 2022-06-03 NOTE — Progress Notes (Signed)
  Subjective:     Patient ID: Brenda Carlson, female   DOB: 11/27/1995, 26 y.o.   MRN: [id number]  HPI Brenda Carlson is a 26 year old white female,single, G2P2002, in for nexplanon insertion.  Last pap was negative malignancy and HPV 12/20/20.   Review of Systems For nexplanon insertion Reviewed past medical,surgical, social and family history. Reviewed medications and allergies.     Objective:   Physical Exam BP 112/67 (BP Location: Left Arm, Patient Position: Sitting, Cuff Size: Normal)   Pulse 62   Ht 5\' 7"  (1.702 m)   Wt 152 lb 8 oz (69.2 kg)   LMP 05/28/2022 (Approximate)   BMI 23.88 kg/m     UPT is negative. Consent signed, time out called. Left arm cleansed with betadine, and injected with 1.5 cc 2% lidocaine and waited til numb. Nexplanon easily inserted and steri strips applied.Rod easily palpated by provider and pt. Pressure dressing applied.   Upstream - 06/03/22 0931       Pregnancy Intention Screening   Does the patient want to become pregnant in the next year? No    Does the patient's partner want to become pregnant in the next year? No    Would the patient like to discuss contraceptive options today? No      Contraception Wrap Up   Current Method Abstinence    End Method Hormonal Implant             Assessment:     1. Pregnancy examination or test, negative result  2. Nexplanon insertion Lot: [id number] Use condoms x 2 weeks, keep clean and dry x 24 hours, no heavy lifting, keep steri strips on x 72 hours, Keep pressure dressing on x 24 hours. Follow up prn problems.     Plan:     Remove in 3 years or sooner if desired

## 2022-06-03 NOTE — Addendum Note (Signed)
Addended by: Linton Rump on: 06/03/2022 10:14 AM   Modules accepted: Orders
